# Patient Record
Sex: Male | Born: 1945 | Race: Black or African American | Hispanic: No | Marital: Married | Smoking: Never smoker
Health system: Southern US, Community
[De-identification: ages and names within clinical notes are randomized; demographics above are authoritative.]

---

## 2007-02-02 ENCOUNTER — Ambulatory Visit: Payer: Self-pay

## 2010-10-01 ENCOUNTER — Emergency Department: Payer: Self-pay | Admitting: *Deleted

## 2010-10-02 ENCOUNTER — Emergency Department: Payer: Self-pay | Admitting: Unknown Physician Specialty

## 2016-03-19 ENCOUNTER — Other Ambulatory Visit: Payer: Self-pay | Admitting: Internal Medicine

## 2016-03-19 DIAGNOSIS — I639 Cerebral infarction, unspecified: Secondary | ICD-10-CM

## 2016-03-23 ENCOUNTER — Other Ambulatory Visit (HOSPITAL_COMMUNITY): Payer: Self-pay | Admitting: Internal Medicine

## 2016-03-23 DIAGNOSIS — I639 Cerebral infarction, unspecified: Secondary | ICD-10-CM

## 2016-03-30 ENCOUNTER — Ambulatory Visit (HOSPITAL_COMMUNITY)
Admission: RE | Admit: 2016-03-30 | Discharge: 2016-03-30 | Disposition: A | Discharge status: Home / Self Care | Payer: Medicare Other | Source: Ambulatory Visit | Attending: Internal Medicine | Admitting: Internal Medicine

## 2016-03-30 DIAGNOSIS — G319 Degenerative disease of nervous system, unspecified: Secondary | ICD-10-CM | POA: Diagnosis not present

## 2016-03-30 DIAGNOSIS — I639 Cerebral infarction, unspecified: Secondary | ICD-10-CM | POA: Insufficient documentation

## 2017-07-13 ENCOUNTER — Emergency Department: Payer: Medicare Other

## 2017-07-13 ENCOUNTER — Encounter: Payer: Self-pay | Admitting: Internal Medicine

## 2017-07-13 ENCOUNTER — Inpatient Hospital Stay
Admission: EM | Admit: 2017-07-13 | Discharge: 2017-07-19 | DRG: 871 | Disposition: A | Discharge status: Hospice | Payer: Medicare Other | Attending: Internal Medicine | Admitting: Internal Medicine

## 2017-07-13 DIAGNOSIS — G309 Alzheimer's disease, unspecified: Secondary | ICD-10-CM | POA: Diagnosis present

## 2017-07-13 DIAGNOSIS — R0602 Shortness of breath: Secondary | ICD-10-CM

## 2017-07-13 DIAGNOSIS — F0390 Unspecified dementia without behavioral disturbance: Secondary | ICD-10-CM

## 2017-07-13 DIAGNOSIS — Z8521 Personal history of malignant neoplasm of larynx: Secondary | ICD-10-CM

## 2017-07-13 DIAGNOSIS — Y95 Nosocomial condition: Secondary | ICD-10-CM | POA: Diagnosis present

## 2017-07-13 DIAGNOSIS — E876 Hypokalemia: Secondary | ICD-10-CM | POA: Diagnosis present

## 2017-07-13 DIAGNOSIS — T8383XA Hemorrhage of genitourinary prosthetic devices, implants and grafts, initial encounter: Secondary | ICD-10-CM | POA: Diagnosis not present

## 2017-07-13 DIAGNOSIS — R1310 Dysphagia, unspecified: Secondary | ICD-10-CM | POA: Diagnosis present

## 2017-07-13 DIAGNOSIS — J9690 Respiratory failure, unspecified, unspecified whether with hypoxia or hypercapnia: Secondary | ICD-10-CM

## 2017-07-13 DIAGNOSIS — R319 Hematuria, unspecified: Secondary | ICD-10-CM | POA: Diagnosis not present

## 2017-07-13 DIAGNOSIS — R627 Adult failure to thrive: Secondary | ICD-10-CM | POA: Diagnosis present

## 2017-07-13 DIAGNOSIS — Z9911 Dependence on respirator [ventilator] status: Secondary | ICD-10-CM

## 2017-07-13 DIAGNOSIS — A419 Sepsis, unspecified organism: Secondary | ICD-10-CM | POA: Diagnosis present

## 2017-07-13 DIAGNOSIS — Z7401 Bed confinement status: Secondary | ICD-10-CM

## 2017-07-13 DIAGNOSIS — Z515 Encounter for palliative care: Secondary | ICD-10-CM | POA: Diagnosis present

## 2017-07-13 DIAGNOSIS — F0280 Dementia in other diseases classified elsewhere without behavioral disturbance: Secondary | ICD-10-CM | POA: Diagnosis present

## 2017-07-13 DIAGNOSIS — I10 Essential (primary) hypertension: Secondary | ICD-10-CM | POA: Diagnosis present

## 2017-07-13 DIAGNOSIS — J159 Unspecified bacterial pneumonia: Secondary | ICD-10-CM | POA: Diagnosis present

## 2017-07-13 DIAGNOSIS — E43 Unspecified severe protein-calorie malnutrition: Secondary | ICD-10-CM

## 2017-07-13 DIAGNOSIS — Z8673 Personal history of transient ischemic attack (TIA), and cerebral infarction without residual deficits: Secondary | ICD-10-CM

## 2017-07-13 DIAGNOSIS — Z681 Body mass index (BMI) 19 or less, adult: Secondary | ICD-10-CM

## 2017-07-13 DIAGNOSIS — J690 Pneumonitis due to inhalation of food and vomit: Secondary | ICD-10-CM | POA: Diagnosis present

## 2017-07-13 DIAGNOSIS — Z79899 Other long term (current) drug therapy: Secondary | ICD-10-CM

## 2017-07-13 DIAGNOSIS — Z7189 Other specified counseling: Secondary | ICD-10-CM | POA: Diagnosis not present

## 2017-07-13 DIAGNOSIS — I248 Other forms of acute ischemic heart disease: Secondary | ICD-10-CM | POA: Diagnosis present

## 2017-07-13 DIAGNOSIS — R739 Hyperglycemia, unspecified: Secondary | ICD-10-CM | POA: Diagnosis present

## 2017-07-13 DIAGNOSIS — Z66 Do not resuscitate: Secondary | ICD-10-CM | POA: Diagnosis not present

## 2017-07-13 DIAGNOSIS — R531 Weakness: Secondary | ICD-10-CM | POA: Diagnosis not present

## 2017-07-13 DIAGNOSIS — J189 Pneumonia, unspecified organism: Secondary | ICD-10-CM

## 2017-07-13 DIAGNOSIS — J9601 Acute respiratory failure with hypoxia: Secondary | ICD-10-CM | POA: Diagnosis present

## 2017-07-13 DIAGNOSIS — Z7951 Long term (current) use of inhaled steroids: Secondary | ICD-10-CM | POA: Diagnosis not present

## 2017-07-13 DIAGNOSIS — Y658 Other specified misadventures during surgical and medical care: Secondary | ICD-10-CM | POA: Diagnosis not present

## 2017-07-13 LAB — GLUCOSE, CAPILLARY
GLUCOSE-CAPILLARY: 144 mg/dL — AB (ref 65–99)
GLUCOSE-CAPILLARY: 101 mg/dL — AB (ref 65–99)

## 2017-07-13 LAB — URINALYSIS, ROUTINE W REFLEX MICROSCOPIC
BACTERIA UA: NONE SEEN
Bilirubin Urine: NEGATIVE
Glucose, UA: 50 mg/dL — AB
Ketones, ur: 20 mg/dL — AB
Leukocytes, UA: NEGATIVE
Nitrite: NEGATIVE
PROTEIN: 100 mg/dL — AB
SPECIFIC GRAVITY, URINE: 1.015 (ref 1.005–1.030)
pH: 6 (ref 5.0–8.0)

## 2017-07-13 LAB — BLOOD GAS, VENOUS
ACID-BASE DEFICIT: 1.8 mmol/L (ref 0.0–2.0)
Bicarbonate: 23.1 mmol/L (ref 20.0–28.0)
O2 Saturation: 92.3 %
PCO2 VEN: 39 mmHg — AB (ref 44.0–60.0)
PH VEN: 7.38 (ref 7.250–7.430)
Patient temperature: 37
pO2, Ven: 66 mmHg — ABNORMAL HIGH (ref 32.0–45.0)

## 2017-07-13 LAB — CBC WITH DIFFERENTIAL/PLATELET
Basophils Absolute: 0 10*3/uL (ref 0–0.1)
Basophils Relative: 0 %
Eosinophils Absolute: 0 10*3/uL (ref 0–0.7)
Eosinophils Relative: 0 %
HEMATOCRIT: 48.8 % (ref 40.0–52.0)
HEMOGLOBIN: 16.3 g/dL (ref 13.0–18.0)
LYMPHS PCT: 11 %
Lymphs Abs: 1.4 10*3/uL (ref 1.0–3.6)
MCH: 29.2 pg (ref 26.0–34.0)
MCHC: 33.4 g/dL (ref 32.0–36.0)
MCV: 87.3 fL (ref 80.0–100.0)
MONO ABS: 0.7 10*3/uL (ref 0.2–1.0)
MONOS PCT: 6 %
NEUTROS ABS: 10.3 10*3/uL — AB (ref 1.4–6.5)
Neutrophils Relative %: 83 %
Platelets: 233 10*3/uL (ref 150–440)
RBC: 5.59 MIL/uL (ref 4.40–5.90)
RDW: 15.8 % — AB (ref 11.5–14.5)
WBC: 12.4 10*3/uL — ABNORMAL HIGH (ref 3.8–10.6)

## 2017-07-13 LAB — LACTIC ACID, PLASMA
LACTIC ACID, VENOUS: 1.8 mmol/L (ref 0.5–1.9)
Lactic Acid, Venous: 1.9 mmol/L (ref 0.5–1.9)

## 2017-07-13 LAB — COMPREHENSIVE METABOLIC PANEL
ALBUMIN: 3.6 g/dL (ref 3.5–5.0)
ALT: 14 U/L — AB (ref 17–63)
AST: 28 U/L (ref 15–41)
Alkaline Phosphatase: 82 U/L (ref 38–126)
Anion gap: 12 (ref 5–15)
BUN: 18 mg/dL (ref 6–20)
CHLORIDE: 104 mmol/L (ref 101–111)
CO2: 24 mmol/L (ref 22–32)
Calcium: 9.7 mg/dL (ref 8.9–10.3)
Creatinine, Ser: 1.05 mg/dL (ref 0.61–1.24)
GFR calc Af Amer: >60 mL/min (ref >60)
GFR calc non Af Amer: >60 mL/min (ref >60)
GLUCOSE: 174 mg/dL — AB (ref 65–99)
Potassium: 3.4 mmol/L — ABNORMAL LOW (ref 3.5–5.1)
SODIUM: 140 mmol/L (ref 135–145)
Total Bilirubin: 0.8 mg/dL (ref 0.3–1.2)
Total Protein: 7.8 g/dL (ref 6.5–8.1)

## 2017-07-13 LAB — TROPONIN I: Troponin I: 0.03 ng/mL (ref <0.03)

## 2017-07-13 LAB — STREP PNEUMONIAE URINARY ANTIGEN: Strep Pneumo Urinary Antigen: NEGATIVE

## 2017-07-13 LAB — LIPASE, BLOOD: Lipase: 27 U/L (ref 11–51)

## 2017-07-13 LAB — PROCALCITONIN: Procalcitonin: 0.44 ng/mL

## 2017-07-13 LAB — PROTIME-INR
INR: 1.15
Prothrombin Time: 14.6 seconds (ref 11.4–15.2)

## 2017-07-13 LAB — MAGNESIUM: MAGNESIUM: 1.5 mg/dL — AB (ref 1.7–2.4)

## 2017-07-13 LAB — MRSA PCR SCREENING: MRSA BY PCR: NEGATIVE

## 2017-07-13 MED ORDER — SODIUM CHLORIDE 0.9 % IV BOLUS
1000.0000 mL | Freq: Once | INTRAVENOUS | Status: AC
  Administered 2017-07-13: 1000 mL via INTRAVENOUS

## 2017-07-13 MED ORDER — ORAL CARE MOUTH RINSE
15.0000 mL | Freq: Two times a day (BID) | OROMUCOSAL | Status: DC
  Administered 2017-07-13 – 2017-07-18 (×11): 15 mL via OROMUCOSAL

## 2017-07-13 MED ORDER — ENOXAPARIN SODIUM 40 MG/0.4ML ~~LOC~~ SOLN
40.0000 mg | Freq: Every day | SUBCUTANEOUS | Status: DC
  Administered 2017-07-13 – 2017-07-14 (×2): 40 mg via SUBCUTANEOUS
  Filled 2017-07-13 (×2): qty 0.4

## 2017-07-13 MED ORDER — FENTANYL 2500MCG IN NS 250ML (10MCG/ML) PREMIX INFUSION
0.0000 ug/h | INTRAVENOUS | Status: DC
  Administered 2017-07-13: 100 ug/h via INTRAVENOUS
  Administered 2017-07-13: 50 ug/h via INTRAVENOUS
  Filled 2017-07-13: qty 250

## 2017-07-13 MED ORDER — FAMOTIDINE IN NACL 20-0.9 MG/50ML-% IV SOLN
20.0000 mg | INTRAVENOUS | Status: DC
  Administered 2017-07-13 – 2017-07-14 (×2): 20 mg via INTRAVENOUS
  Filled 2017-07-13 (×2): qty 50

## 2017-07-13 MED ORDER — ALBUTEROL SULFATE (2.5 MG/3ML) 0.083% IN NEBU
2.5000 mg | INHALATION_SOLUTION | Freq: Four times a day (QID) | RESPIRATORY_TRACT | Status: DC
  Administered 2017-07-13: 2.5 mg via RESPIRATORY_TRACT
  Filled 2017-07-13: qty 3

## 2017-07-13 MED ORDER — FENTANYL CITRATE (PF) 100 MCG/2ML IJ SOLN
100.0000 ug | Freq: Once | INTRAMUSCULAR | Status: AC
  Administered 2017-07-13: 100 ug via INTRAVENOUS

## 2017-07-13 MED ORDER — VANCOMYCIN HCL IN DEXTROSE 1-5 GM/200ML-% IV SOLN
1000.0000 mg | Freq: Once | INTRAVENOUS | Status: DC
  Filled 2017-07-13: qty 200

## 2017-07-13 MED ORDER — POTASSIUM CHLORIDE 10 MEQ/100ML IV SOLN
10.0000 meq | INTRAVENOUS | Status: AC
  Administered 2017-07-13 (×4): 10 meq via INTRAVENOUS
  Filled 2017-07-13 (×4): qty 100

## 2017-07-13 MED ORDER — ENOXAPARIN SODIUM 40 MG/0.4ML ~~LOC~~ SOLN: 40.0000 mg | SUBCUTANEOUS | Status: DC

## 2017-07-13 MED ORDER — CHLORHEXIDINE GLUCONATE 0.12 % MT SOLN
15.0000 mL | Freq: Two times a day (BID) | OROMUCOSAL | Status: DC
  Administered 2017-07-13 – 2017-07-18 (×10): 15 mL via OROMUCOSAL
  Filled 2017-07-13 (×8): qty 15

## 2017-07-13 MED ORDER — ALBUTEROL SULFATE (2.5 MG/3ML) 0.083% IN NEBU: 2.5000 mg | INHALATION_SOLUTION | RESPIRATORY_TRACT | Status: DC | PRN

## 2017-07-13 MED ORDER — SODIUM CHLORIDE 0.9 % IV SOLN
2.0000 g | Freq: Two times a day (BID) | INTRAVENOUS | Status: DC
  Administered 2017-07-13 – 2017-07-18 (×10): 2 g via INTRAVENOUS
  Filled 2017-07-13 (×12): qty 2

## 2017-07-13 MED ORDER — SODIUM CHLORIDE 0.9 % IV SOLN
2.0000 g | Freq: Once | INTRAVENOUS | Status: AC
  Administered 2017-07-13: 2 g via INTRAVENOUS
  Filled 2017-07-13: qty 2

## 2017-07-13 MED ORDER — CARVEDILOL 3.125 MG PO TABS: 3.1250 mg | ORAL_TABLET | Freq: Two times a day (BID) | ORAL | Status: DC

## 2017-07-13 MED ORDER — KETAMINE HCL 10 MG/ML IJ SOLN
100.0000 mg | Freq: Once | INTRAMUSCULAR | Status: AC
  Administered 2017-07-13: 100 mg via INTRAVENOUS

## 2017-07-13 MED ORDER — ROCURONIUM BROMIDE 50 MG/5ML IV SOLN
60.0000 mg | Freq: Once | INTRAVENOUS | Status: AC
Start: 2017-07-13 — End: 2017-07-13
  Administered 2017-07-13: 60 mg via INTRAVENOUS

## 2017-07-13 MED ORDER — POTASSIUM CHLORIDE 20 MEQ PO PACK
40.0000 meq | PACK | Freq: Once | ORAL | Status: DC
  Filled 2017-07-13: qty 2

## 2017-07-13 MED ORDER — CARVEDILOL 3.125 MG PO TABS
3.1250 mg | ORAL_TABLET | Freq: Two times a day (BID) | ORAL | Status: DC
Start: 2017-07-13 — End: 2017-07-13
  Filled 2017-07-13: qty 1

## 2017-07-13 NOTE — Progress Notes (Signed)
Initial Nutrition Assessment  DOCUMENTATION CODES:   Severe malnutrition in context of chronic illness  INTERVENTION:  Will monitor for diet advancement. Consider SLP evaluation prior to diet advancement as patient has a history of dysphagia with a baseline diet of dysphagia 1 (pureed with thin liquids).  RD will order appropriate oral nutrition supplement to address malnutrition once diet is advanced.  Recommend checking phosphorus lab and supplementing as needed. Patient is at risk for refeeding syndrome. Noted potassium and magnesium have already been checked.  NUTRITION DIAGNOSIS:   Severe Malnutrition related to chronic illness(Alzheimer's disease, dysphagia, advanced age) as evidenced by severe fat depletion, moderate muscle depletion, severe muscle depletion.  GOAL:   Patient will meet greater than or equal to 90% of their needs  MONITOR:   PO intake, Supplement acceptance, Diet advancement, Labs, Weight trends, I & O's  REASON FOR ASSESSMENT:   Ventilator    ASSESSMENT:   72 year old male with PMHx of Alzheimer's dementia, depression, constipation who was admitted from [facility] with acute hypoxic respiratory failure from PNA. Patient was initially placed on mechanical ventilation but was extubated <12 hours later on 5/15.   Patient was initially assessed for mechanical ventilation, but was extubated following RD assessment.  Met with patient's family members at bedside. They report he resides at [facility]. He has had a poor appetite for a while, but they are unsure how much of his meals he eats. He is on a dysphagia 1 (pureed) diet with thin liquids due to dysphagia (verified this in chart). He is edentulous and does not have any dentures. They are unsure if he drinks any oral nutrition supplements (did not see any ordered at [facility]) and are also unsure of his weight history. There is no weight history in chart. RN obtained a weight of 138.5 lbs today. He  is '5\' 11"'$  according to the family. Patient is bed-bound and requires assistance with most ADLs.  Medications reviewed and include: cefepime, famotidine, potassium chloride 10 mEQ IV 4 times today, vancomycin.  Labs reviewed: CBG 144, Potassium 3.4, Magnesium 1.5.  Discussed with RN and on rounds.  NUTRITION - FOCUSED PHYSICAL EXAM:    Most Recent Value  Orbital Region  Severe depletion  Upper Arm Region  Severe depletion  Thoracic and Lumbar Region  Severe depletion  Buccal Region  Unable to assess  Temple Region  Severe depletion  Clavicle Bone Region  Severe depletion  Clavicle and Acromion Bone Region  Severe depletion  Scapular Bone Region  Unable to assess  Dorsal Hand  Severe depletion  Patellar Region  Moderate depletion  Anterior Thigh Region  Moderate depletion  Posterior Calf Region  Severe depletion  Edema (RD Assessment)  None  Hair  Reviewed  Eyes  Unable to assess  Mouth  Unable to assess  Skin  Reviewed  Nails  Reviewed     Diet Order:   Diet Order           Diet NPO time specified  Diet effective now          EDUCATION NEEDS:   Not appropriate for education at this time  Skin:  Skin Assessment: Reviewed RN Assessment  Last BM:  Unknown  Height:   Ht Readings from Last 1 Encounters:  07/13/17 '5\' 11"'$  (1.803 m)    Weight:   Wt Readings from Last 1 Encounters:  07/13/17 138 lb 7.2 oz (62.8 kg)    Ideal Body Weight:  78.2 kg  BMI:  Body mass index is 19.31 kg/m.  Estimated Nutritional Needs:   Kcal:  1856-3149 (MSJ x 1.2-1.4)  Protein:  80-95 grams (1.3-1.5 grams/kg)  Fluid:  1.6-1.8 L/day (25-30 mL/kg)  Willey Blade, MS, RD, LDN Office: [phone] Pager: [phone] After Hours/Weekend Pager: [phone]

## 2017-07-13 NOTE — Progress Notes (Signed)
Patient ID: Isaac Boyle, male   DOB: 1945/11/13, 72 y.o.   MRN: [id number]  [employer] PROGRESS NOTE  Isaac Boyle:[id number] DOB: 1945/06/17 DOA: 07/13/2017 PCP: Keane Police, MD  HPI/Subjective: Patient intubated in the emergency room.  The patient has a history of dementia.  He needs to be fed with all meals.  He has bedbound for the last 3 years.  The wife thinks he does recognize her.  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  Objective: Vitals:   07/13/17 1200 07/13/17 1300  BP: 101/78   Pulse: 96 87  Resp: 15 10  Temp: 98.8 F (37.1 C) 98.4 F (36.9 C)  SpO2: 98% 98%    Filed Weights   07/13/17 1100  Weight: 62.8 kg (138 lb 7.2 oz)    ROS: Review of Systems  Unable to perform ROS: Intubated   Exam: Physical Exam  HENT:  Nose: No mucosal edema.  Mouth/Throat: No oropharyngeal exudate or posterior oropharyngeal edema.  Mouth is dry.  Tube in the mouth has a little bit of blood in it.  Eyes: Conjunctivae and lids are normal.  Neck: No JVD present. Carotid bruit is not present. No edema present. No thyroid mass and no thyromegaly present.  Cardiovascular: S1 normal and S2 normal. Exam reveals no gallop.  No murmur heard. Respiratory: No respiratory distress. He has decreased breath sounds in the right lower field and the left lower field. He has no wheezes. He has no rhonchi. He has no rales.  GI: Soft. Bowel sounds are normal. There is no tenderness.  Musculoskeletal:       Right ankle: He exhibits no swelling.       Left ankle: He exhibits no swelling.  Lymphadenopathy:    He has no cervical adenopathy.  Neurological:  Was able to squeeze my hand when I saw him to command  Skin: Skin is warm. No rash noted. Nails show no clubbing.  Psychiatric:  Patient is intubated      Data Reviewed: Basic Metabolic Panel: Recent Labs  Lab 07/13/17 0409 07/13/17 0635  NA 140  --   K 3.4*  --   CL 104  --   CO2 24  --   GLUCOSE 174*   --   BUN 18  --   CREATININE 1.05  --   CALCIUM 9.7  --   MG  --  1.5*   Liver Function Tests: Recent Labs  Lab 07/13/17 0409  AST 28  ALT 14*  ALKPHOS 82  BILITOT 0.8  PROT 7.8  ALBUMIN 3.6   Recent Labs  Lab 07/13/17 0409  LIPASE 27   CBC: Recent Labs  Lab 07/13/17 0409  WBC 12.4*  NEUTROABS 10.3*  HGB 16.3  HCT 48.8  MCV 87.3  PLT 233   Cardiac Enzymes: Recent Labs  Lab 07/13/17 0409  TROPONINI 0.03*    CBG: Recent Labs  Lab 07/13/17 0616  GLUCAP 144*    Recent Results (from the past 240 hour(s))  Blood Culture (routine x 2)     Status: None (Preliminary result)   Collection Time: 07/13/17  4:09 AM  Result Value Ref Range Status   Specimen Description BLOOD RIGHT WRIST  Final   Special Requests   Final    BOTTLES DRAWN AEROBIC AND ANAEROBIC Blood Culture results may not be optimal due to an excessive volume of blood received in culture bottles   Culture   Final    NO GROWTH < 12 HOURS  Performed at [facility], [address] [place], [place] [address]    Report Status PENDING  Incomplete  Blood Culture (routine x 2)     Status: None (Preliminary result)   Collection Time: 07/13/17  4:09 AM  Result Value Ref Range Status   Specimen Description BLOOD LEFT AC  Final   Special Requests   Final    BOTTLES DRAWN AEROBIC AND ANAEROBIC Blood Culture results may not be optimal due to an excessive volume of blood received in culture bottles   Culture   Final    NO GROWTH < 12 HOURS Performed at [facility], [address], [place], [place] [address]    Report Status PENDING  Incomplete  Culture, respiratory (NON-Expectorated)     Status: None (Preliminary result)   Collection Time: 07/13/17  4:10 AM  Result Value Ref Range Status   Specimen Description   Final    TRACHEAL ASPIRATE Performed at [facility], [address] [place], [place] [address]    Special Requests   Final    Normal Performed at [facility], [address] [place], [place] [address]    Gram Stain   Final    FEW WBC PRESENT, PREDOMINANTLY PMN RARE GRAM POSITIVE COCCI IN PAIRS RARE GRAM POSITIVE RODS Performed at [facility], [address], [place], [place] [address]    Culture PENDING  Incomplete   Report Status PENDING  Incomplete  MRSA PCR Screening     Status: None   Collection Time: 07/13/17  7:33 AM  Result Value Ref Range Status   MRSA by PCR NEGATIVE NEGATIVE Final    Comment:        The GeneXpert MRSA Assay (FDA approved for NASAL specimens only), is one component of a comprehensive MRSA colonization surveillance program. It is not intended to diagnose MRSA infection nor to guide or monitor treatment for MRSA infections. Performed at [facility], [address] [place], [place] [address]      Studies: Dg Chest [facility] 1 View  Result Date: 07/13/2017 CLINICAL DATA:  Endotracheal tube placement. EXAM: PORTABLE CHEST 1 VIEW COMPARISON:  None. FINDINGS: The patient's endotracheal tube is seen ending 2 cm above the carina. An enteric tube is noted extending below the diaphragm, overlying the fundus of the stomach. This could be advanced approximately 5 cm. A small left pleural effusion is noted. Left basilar airspace opacity may reflect atelectasis or possibly infection. No pneumothorax is seen. The cardiomediastinal silhouette is unremarkable. No acute osseous abnormalities are identified. IMPRESSION: 1. Endotracheal tube seen ending 2 cm above the carina. 2. Enteric tube noted extending below the diaphragm, overlying the fundus of the stomach. This could be advanced approximately 5 cm, as deemed clinically appropriate. 3. Small left pleural effusion noted. Left basilar airspace opacity may reflect atelectasis or possibly infection. Electronically Signed   By: Roanna Raider M.D.   On: 07/13/2017 04:20    Scheduled Meds: . enoxaparin (LOVENOX) injection  40 mg Subcutaneous Daily    Continuous Infusions: . ceFEPime (MAXIPIME) IV    . famotidine (PEPCID) IV Stopped (07/13/17 1008)  . potassium chloride 10 mEq (07/13/17 1249)  . vancomycin      Assessment/Plan:  1. Clinical sepsis with healthcare associated pneumonia.  Started on cefepime and vancomycin.   2. Acute hypoxic respiratory failure.  Patient was intubated in the emergency room.  I do not see the ER physician notes at this time.  Case discussed  with critical care specialist to start the weaning process.  Patient was stopped on sedation. 3. End-stage dementia.  CODE STATUS discussed with son and wife at the bedside.  Son was thinking the patient should be a DNR but the wife was unsure at this point.  Patient needs total care with feeding and he is bedbound.  Unsure if he recognizes people at this point. 4. History of hypertension.  Coreg on hold with relative hypotension at this point.   Family Communication: Spoke with son and wife at the bedside Disposition Plan: To be determined  Consultants:  Critical care specialist  Antibiotics:  Vancomycin  Cefepime  Time spent: 30 minutes.  Patient is critically ill.  Izaak Sahr [employer]

## 2017-07-13 NOTE — ED Notes (Signed)
Pt resting quietly with family at the bedside. Discussed plan of care with family.

## 2017-07-13 NOTE — Progress Notes (Signed)
CODE SEPSIS - PHARMACY COMMUNICATION  **Broad Spectrum Antibiotics should be administered within 1 hour of Sepsis diagnosis**  Time Code Sepsis Called/Page Received: 1610 9604  Antibiotics Ordered: 0515 0409 vanc/cefepime  Time of 1st antibiotic administration: 5409 8119  Additional action taken by pharmacy:   If necessary, Name of Provider/Nurse Contacted:     Erich Montane ,PharmD Clinical Pharmacist  07/13/2017  6:07 AM

## 2017-07-13 NOTE — Progress Notes (Signed)
OG tube noted to be coiled in mouth upon assessment. Several attempts made to re-insert same without success. Dr. Sung Amabile aware and stated to hold off on further attempts due to possible extubation today. Will reevaluate if unable to extubate.

## 2017-07-13 NOTE — Progress Notes (Signed)
Patient is placed on high fowlers position, cuff deflated, suctioned orally and endotracheally and then extubated to 4 lpm O2 [place] 

## 2017-07-13 NOTE — H&P (Addendum)
[facility] - [place] at [facility]   PATIENT NAME: Isaac Boyle    MR#:  [id number]  DATE OF BIRTH:  June 27, 1945  DATE OF ADMISSION:  07/13/2017  PRIMARY CARE PHYSICIAN: Keane Police, MD   REQUESTING/REFERRING PHYSICIAN: Merrily Brittle, MD  CHIEF COMPLAINT:   Chief Complaint  Patient presents with  . Shortness of Breath    HISTORY OF PRESENT ILLNESS:  Isaac Boyle  is a 72 y.o. male with a known history of Alzheimer's dementia who presents from [facility] w/ SOB/respiratory failure. Pt's two sons and wife at bedside. Pt was apparently diagnosed w/ pneumonia by CXR during the day, and was started on Levaquin. Pt's wife states she was called at 0330AM on Wednesday 07/13/2017, was informed that pt tachypneic/hypoxemic, EMS called for respiratory distress and non-reassuring breathing. Intubated in ED. Tachycardic, BP stable. SIRS (+). Diffusely diminished breath sounds B/L (L > R), coarse expiratory breath sounds, bibasilar fine crackles on lung exam. Appears thin and chronically-ill. Pt apparently non-verbal, bedbound w/ poor functional status at baseline. Family states pt on pureed diet for dysphagia at facility. Labwork (+) mild hypokalemia (3.4), hyperglycemia (174), leukocytosis (12.4). CXR (+) "Small left pleural effusion noted. Left basilar airspace opacity may reflect atelectasis or possibly infection." Admitted for treatment of sepsis 2/2 suspect acute bacterial healthcare acquired pneumonia, w/ acute hypoxemic respiratory failure.  PAST MEDICAL HISTORY:  History reviewed. No pertinent past medical history.   Dementia  PAST SURGICAL HISTORY:  History reviewed. No pertinent surgical history.  SOCIAL HISTORY:   Social History   Tobacco Use  . Smoking status: Not on file  Substance Use Topics  . Alcohol use: Not on file    FAMILY HISTORY:  History reviewed. No pertinent family history.  DRUG ALLERGIES:  No Known Allergies  REVIEW  OF SYSTEMS:   Review of Systems  Unable to perform ROS: Intubated  Respiratory: Positive for shortness of breath.     MEDICATIONS AT HOME:   Prior to Admission medications   Medication Sig Start Date End Date Taking? Authorizing Provider  albuterol (ACCUNEB) 0.63 MG/3ML nebulizer solution Take 1 ampule by nebulization every 6 (six) hours as needed for wheezing.   Yes [provider]  carvedilol (COREG) 3.125 MG tablet Take 3.125 mg by mouth 2 (two) times daily with a meal.   Yes [provider]  levofloxacin (LEVAQUIN) 250 MG tablet Take 250 mg by mouth daily.   Yes [provider]  Melatonin 5 MG TABS Take 1 tablet by mouth at bedtime.   Yes [provider]  potassium chloride (K-DUR,KLOR-CON) 10 MEQ tablet Take 10 mEq by mouth daily.   Yes [provider]  psyllium (HYDROCIL/METAMUCIL) 95 % PACK Take 1 packet by mouth daily.   Yes [provider]      VITAL SIGNS:  Blood pressure (!) 144/96, pulse (!) 120, temperature 98.4 F (36.9 C), resp. rate 20, SpO2 98 %.  PHYSICAL EXAMINATION:  Physical Exam  Constitutional: He appears well-developed. He appears cachectic. He appears toxic. He has a sickly appearance. He appears ill. No distress. He is sedated and intubated.  HENT:  Head: Atraumatic.  Eyes: Conjunctivae and lids are normal. No scleral icterus.  Neck: Neck supple. No JVD present. No thyromegaly present.  Cardiovascular: Regular rhythm, S1 normal, S2 normal and normal heart sounds.  No extrasystoles are present. Tachycardia present. Exam reveals no gallop, no S3, no S4, no distant heart sounds and no friction rub.  No murmur  heard. Pulmonary/Chest: No accessory muscle usage or stridor. He is intubated. He has decreased breath sounds in the right upper field, the right middle field, the right lower field, the left upper field, the left middle field and the left lower field. He has no wheezes. He has rhonchi in the right  lower field and the left lower field. He has rales in the right upper field, the right middle field, the right lower field, the left upper field, the left middle field and the left lower field.  Abdominal: Soft. Bowel sounds are normal. He exhibits no distension. There is no tenderness. There is no rebound and no guarding.  Musculoskeletal: He exhibits no edema.  Lymphadenopathy:    He has no cervical adenopathy.  Neurological: He is unresponsive.  Intubated/sedated.  Skin: Skin is warm and dry. No rash noted. He is not diaphoretic. No erythema.  Psychiatric:  Intubated/sedated.   LABORATORY PANEL:   CBC Recent Labs  Lab 07/13/17 0409  WBC 12.4*  HGB 16.3  HCT 48.8  PLT 233   ------------------------------------------------------------------------------------------------------------------  Chemistries  Recent Labs  Lab 07/13/17 0409  NA 140  K 3.4*  CL 104  CO2 24  GLUCOSE 174*  BUN 18  CREATININE 1.05  CALCIUM 9.7  AST 28  ALT 14*  ALKPHOS 82  BILITOT 0.8   ------------------------------------------------------------------------------------------------------------------  Cardiac Enzymes Recent Labs  Lab 07/13/17 0409  TROPONINI 0.03*   ------------------------------------------------------------------------------------------------------------------  RADIOLOGY:  Dg Chest Port 1 View  Result Date: 07/13/2017 CLINICAL DATA:  Endotracheal tube placement. EXAM: PORTABLE CHEST 1 VIEW COMPARISON:  None. FINDINGS: The patient's endotracheal tube is seen ending 2 cm above the carina. An enteric tube is noted extending below the diaphragm, overlying the fundus of the stomach. This could be advanced approximately 5 cm. A small left pleural effusion is noted. Left basilar airspace opacity may reflect atelectasis or possibly infection. No pneumothorax is seen. The cardiomediastinal silhouette is unremarkable. No acute osseous abnormalities are identified. IMPRESSION: 1.  Endotracheal tube seen ending 2 cm above the carina. 2. Enteric tube noted extending below the diaphragm, overlying the fundus of the stomach. This could be advanced approximately 5 cm, as deemed clinically appropriate. 3. Small left pleural effusion noted. Left basilar airspace opacity may reflect atelectasis or possibly infection. Electronically Signed   By: Roanna Raider M.D.   On: 07/13/2017 04:20   IMPRESSION AND PLAN:   A/P: 44M w/ dementia presents from NH w/ sepsis 2/2 acute bacterial healthcare acquired pneumonia + acute hypoxemic respiratory failure, hypokalemia, hyperglycemia. -Intubated + sedated -Broad-spectrum ABx (Cefepime + Vanc) -BCx, sputum Cx, UStrep + ULegionella -Nebs for mobilization of secretions -Suction -Monitor PCT, Lactate -Replete K+ -Mag level pending -c/w home Coreg -NPO -Lovenox -Full code per pt's family -Admission, > 2 midnights -Likely a good hospice candidate (though family was already upset, and I chose not upset them further by broaching this subject)  ACP: Had a long conversation (~20+min) w/ family regarding code status. Pt's spouse states she wishes for pt to be full code. Explained disease course, natural history and progression of Alzheimer's dementia as a debilitating, irreversible, terminal medical condition. Pt's poor functional status will invariably correlate w/ poor long-term outcome, w/ CPR likely representing a medically futile measure. Did not compel family to change code status, simply provided information in hopes of stimulating further thought, discussion and reflection.  All the records are reviewed and case discussed with ED provider. Management plans discussed with the patient, family and they are in agreement.  CODE STATUS: Full code  TOTAL TIME TAKING CARE OF THIS PATIENT: 90 minutes.    Barbaraann Rondo M.D on 07/13/2017 at 6:40 AM  Between 7am to 6pm - Pager - [phone]  After 6pm go to [email or web address] - Air traffic controller  [facility]  Office  [phone]  CC: Primary care physician; Keane Police, MD   Note: This dictation was prepared with Dragon dictation along with smaller phrase technology. Any transcriptional errors that result from this process are unintentional.

## 2017-07-13 NOTE — Progress Notes (Signed)
Since taking over patients care this afternoon patient has been arousable to voice but has not followed commands.  Resists against repositioning and localizes pain.  Congested weak cough that is non-productive.  o2 sats mid 90's to 100 on 4 L nasal canula.  Family has visited.

## 2017-07-13 NOTE — Consult Note (Signed)
 Name: Isaac Boyle MRN: [id number] DOB: 09/25/1945    ADMISSION DATE:  07/13/2017 CONSULTATION DATE: 07/13/2017  REFERRING MD : Dr. Sridharan   CHIEF COMPLAINT: Shortness of Breath   BRIEF PATIENT DESCRIPTION:  72 yo male admitted with acute hypoxic respiratory failure secondary to pneumonia requiring mechanical intubation   SIGNIFICANT EVENTS/STUDIES:  05/15 Pt admitted to ICU mechanically intubated   HISTORY OF PRESENT ILLNESS:   This is a 72 yo male with a PMH of Dementia and Nonverbal at Baseline.  He presented to [facility] ER via EMS on 05/15 from [facility] with hypoxia O2 sats 84% on RA requiring NRB.  Per ER notes he was diagnosed with pneumonia on 05/14 and prescribed Levaquin.  In the ER due to worsening respiratory distress pt mechanically intubated.  CXR concerning for possible pneumonia vs. atelectasis.  He was subsequently admitted to ICU by hospitalist team for further workup and treatment.   PAST MEDICAL HISTORY :   has no past medical history on file.  has no past surgical history on file. Prior to Admission medications   Medication Sig Start Date End Date Taking? Authorizing Provider  albuterol (ACCUNEB) 0.63 MG/3ML nebulizer solution Take 1 ampule by nebulization every 6 (six) hours as needed for wheezing.   Yes [provider]  carvedilol (COREG) 3.125 MG tablet Take 3.125 mg by mouth 2 (two) times daily with a meal.   Yes [provider]  levofloxacin (LEVAQUIN) 250 MG tablet Take 250 mg by mouth daily.   Yes [provider]  Melatonin 5 MG TABS Take 1 tablet by mouth at bedtime.   Yes [provider]  potassium chloride (K-DUR,KLOR-CON) 10 MEQ tablet Take 10 mEq by mouth daily.   Yes [provider]  psyllium (HYDROCIL/METAMUCIL) 95 % PACK Take 1 packet by mouth daily.   Yes [provider]   No Known Allergies  FAMILY HISTORY:  family history is not on file. SOCIAL HISTORY:    REVIEW OF SYSTEMS:     Unable to assess pt intubated   SUBJECTIVE:  Unable to assess pt intubated   VITAL SIGNS: Temp:  [98.3 F (36.8 C)] 98.3 F (36.8 C) (05/15 0430) Pulse Rate:  [124-143] 124 (05/15 0430) Resp:  [20-21] 21 (05/15 0430) BP: (151-170)/(111-113) 154/112 (05/15 0430) SpO2:  [93 %-98 %] 98 % (05/15 0430) FiO2 (%):  [100 %] 100 % (05/15 0400)  PHYSICAL EXAMINATION: General: acutely ill male, NAD mechanically intubated  Neuro: sedated, not following commands, PERRL HEENT: supple, no JVD Cardiovascular: sinus tach, no R/G Lungs: rhonchi and diminished throughout, even, non labored  Abdomen: +BS x4, soft, non distended  Musculoskeletal: normal bulk and tone, no edema  Skin: intact no rashes or lesions   Recent Labs  Lab 07/13/17 0409  NA 140  K 3.4*  CL 104  CO2 24  BUN 18  CREATININE 1.05  GLUCOSE 174*   Recent Labs  Lab 07/13/17 0409  HGB 16.3  HCT 48.8  WBC 12.4*  PLT 233   Dg Chest Port 1 View  Result Date: 07/13/2017 CLINICAL DATA:  Endotracheal tube placement. EXAM: PORTABLE CHEST 1 VIEW COMPARISON:  None. FINDINGS: The patient's endotracheal tube is seen ending 2 cm above the carina. An enteric tube is noted extending below the diaphragm, overlying the fundus of the stomach. This could be advanced approximately 5 cm. A small left pleural effusion is noted. Left basilar airspace opacity may reflect atelectasis or possibly infection. No pneumothorax is seen.   The cardiomediastinal silhouette is unremarkable. No acute osseous abnormalities are identified. IMPRESSION: 1. Endotracheal tube seen ending 2 cm above the carina. 2. Enteric tube noted extending below the diaphragm, overlying the fundus of the stomach. This could be advanced approximately 5 cm, as deemed clinically appropriate. 3. Small left pleural effusion noted. Left basilar airspace opacity may reflect atelectasis or possibly infection. Electronically Signed   By: Garald Balding M.D.   On: 07/13/2017 04:20     ASSESSMENT / PLAN: Acute hypoxic respiratory failure secondary to pneumonia  Hypokalemia  Elevated troponin likely secondary to demand ischemia in setting of respiratory failure  P: Full vent support-vent settings reviewed and established  SBT once all parameters met  VAP bundle implemented  Continuous telemetry monitoring  Trend troponin's  Trend BMP Replace electrolytes as indicated  Monitor UOP  SUP px: iv pepcid Keep NPO for now  Trend WBC and monitor fever curve Follow cultures Continue current abx VTE px: subq lovenox  Trend CBC Monitor for s/sx of bleeding and transfuse for hgb <7 RASS goal 0 to -1 Fentanyl gtt to maintain RASS goal and for pain management WUA daily   Marda Stalker, [place] Pager [phone] (please enter 7 digits) PCCM Consult Pager [phone] (please enter 7 digits)

## 2017-07-13 NOTE — Progress Notes (Signed)
eLink Physician-Brief Progress Note Patient Name: Isaac Boyle DOB: May 02, 1945 MRN: [id number]   Date of Service  07/13/2017  HPI/Events of Note  72 yo male resident of NH. Presents with SOB and hypoxia. Diagnosis - pneumonia. Now intubated and ventilated. PCCM asked to assume care in the ICU. Incomplete ED physician note in Epic. No H&P in Epic either.  VSS.   eICU Interventions  No new orders.      Intervention Category Evaluation Type: New Patient Evaluation  Lenell Antu 07/13/2017, 6:02 AM

## 2017-07-13 NOTE — ED Provider Notes (Signed)
[facility] Emergency Department Provider Note  ____________________________________________   First MD Initiated Contact with Patient 07/13/17 [phone]     (approximate)  I have reviewed the triage vital signs and the nursing notes.   HISTORY  Chief Complaint Shortness of Breath  Level 5 exemption history limited by the patient's clinical condition  HPI Isaac Boyle is a 72 y.o. male who comes to the emergency department via EMS with cough and shortness of breath.  The patient has a history of severe dementia and apparently at his nursing home today was diagnosed with pneumonia and given a first dose of Levaquin.  At some point his respiratory status worsened in the called 911.  The patient himself is unable to provide any meaningful history.  History reviewed. No pertinent past medical history.  Patient Active Problem List   Diagnosis Date Noted  . Healthcare associated bacterial pneumonia 07/13/2017  . Protein-calorie malnutrition, severe 07/13/2017    History reviewed. No pertinent surgical history.  Prior to Admission medications   Medication Sig Start Date End Date Taking? Authorizing Provider  albuterol (ACCUNEB) 0.63 MG/3ML nebulizer solution Take 1 ampule by nebulization every 6 (six) hours as needed for wheezing.   Yes [provider]  carvedilol (COREG) 3.125 MG tablet Take 3.125 mg by mouth 2 (two) times daily with a meal.   Yes [provider]  levofloxacin (LEVAQUIN) 250 MG tablet Take 250 mg by mouth daily.   Yes [provider]  Melatonin 5 MG TABS Take 1 tablet by mouth at bedtime.   Yes [provider]  potassium chloride (K-DUR,KLOR-CON) 10 MEQ tablet Take 10 mEq by mouth daily.   Yes [provider]  psyllium (HYDROCIL/METAMUCIL) 95 % PACK Take 1 packet by mouth daily.   Yes [provider]    Allergies Patient has no known allergies.  History reviewed. No pertinent family  history.  Social History Social History   Tobacco Use  . Smoking status: Not on file  Substance Use Topics  . Alcohol use: Not on file  . Drug use: Not on file    Review of Systems Level 5 exemption history limited by the patient's clinical condition  ____________________________________________   PHYSICAL EXAM:  VITAL SIGNS: ED Triage Vitals  Enc Vitals Group     BP      Pulse      Resp      Temp      Temp src      SpO2      Weight      Height      Head Circumference      Peak Flow      Pain Score      Pain Loc      Pain Edu?      Excl. in [place]?     Constitutional: Appears critically ill in severe respiratory distress with coarse breath sounds elevated respiratory rate unable to speak and coughing thick purulent sputum Eyes: PERRL EOMI. Head: Atraumatic. Nose: No congestion/rhinnorhea. Mouth/Throat: No trismus Neck: No stridor.   Cardiovascular: Tachycardic rate, regular rhythm. Grossly normal heart sounds.  Good peripheral circulation. Respiratory: Severe respiratory distress coarse breath sounds throughout Gastrointestinal: Soft nontender Musculoskeletal: No lower extremity edema   Neurologic: Moves all 4 Skin:  Skin is warm, dry and intact. No rash noted. Psychiatric: Severe dementia   ____________________________________________   DIFFERENTIAL includes but not limited to  Sepsis, pneumonia, pneumothorax, pulmonary embolism, urinary tract infection, dehydration ____________________________________________  LABS (all labs ordered are listed, but only abnormal results are displayed)  Labs Reviewed  COMPREHENSIVE METABOLIC PANEL - Abnormal; Notable for the following components:      Result Value   Potassium 3.4 (*)    Glucose, Bld 174 (*)    ALT 14 (*)    All other components within normal limits  TROPONIN I - Abnormal; Notable for the following components:   Troponin I 0.03 (*)    All other components within normal limits  CBC WITH  DIFFERENTIAL/PLATELET - Abnormal; Notable for the following components:   WBC 12.4 (*)    RDW 15.8 (*)    Neutro Abs 10.3 (*)    All other components within normal limits  URINALYSIS, ROUTINE W REFLEX MICROSCOPIC - Abnormal; Notable for the following components:   Color, Urine YELLOW (*)    APPearance CLEAR (*)    Glucose, UA 50 (*)    Hgb urine dipstick SMALL (*)    Ketones, ur 20 (*)    Protein, ur 100 (*)    All other components within normal limits  BLOOD GAS, VENOUS - Abnormal; Notable for the following components:   pCO2, Ven 39 (*)    pO2, Ven 66.0 (*)    All other components within normal limits  GLUCOSE, CAPILLARY - Abnormal; Notable for the following components:   Glucose-Capillary 144 (*)    All other components within normal limits  MAGNESIUM - Abnormal; Notable for the following components:   Magnesium 1.5 (*)    All other components within normal limits  CULTURE, BLOOD (ROUTINE X 2)  CULTURE, BLOOD (ROUTINE X 2)  CULTURE, RESPIRATORY (NON-EXPECTORATED)  MRSA PCR SCREENING  CULTURE, RESPIRATORY (NON-EXPECTORATED)  URINE CULTURE  LACTIC ACID, PLASMA  LACTIC ACID, PLASMA  LIPASE, BLOOD  PROCALCITONIN  PROTIME-INR  STREP PNEUMONIAE URINARY ANTIGEN  HIV ANTIBODY (ROUTINE TESTING)  LEGIONELLA PNEUMOPHILA SEROGP 1 UR AG  PROCALCITONIN  CBC  BASIC METABOLIC PANEL    Lab work reviewed by me with slightly elevated troponin likely secondary to demand.  Elevated white count likely secondary to infection __________________________________________  EKG  ED ECG REPORT I, Darel Hong, the attending physician, personally viewed and interpreted this ECG.  Date: 07/13/2017 EKG Time:  Rate: 128 Rhythm: Sinus tachycardia QRS Axis: normal Intervals: normal ST/T Wave abnormalities: normal Narrative Interpretation: no evidence of acute ischemia  ____________________________________________  RADIOLOGY  Chest x-ray reviewed by me with ET tube in adequate  position.  Concerning for left lower lobe pneumonia ____________________________________________   PROCEDURES  Procedure(s) performed: Yes  .Critical Care Performed by: Darel Hong, MD Authorized by: Darel Hong, MD   Critical care provider statement:    Critical care time (minutes):  40   Critical care time was exclusive of:  Separately billable procedures and treating other patients   Critical care was necessary to treat or prevent imminent or life-threatening deterioration of the following conditions:  Respiratory failure   Critical care was time spent personally by me on the following activities:  Development of treatment plan with patient or surrogate, discussions with consultants, evaluation of patient's response to treatment, examination of patient, obtaining history from patient or surrogate, ordering and performing treatments and interventions, ordering and review of laboratory studies, ordering and review of radiographic studies, pulse oximetry, re-evaluation of patient's condition and review of old charts Procedure Name: Intubation Date/Time: 07/13/2017 9:54 PM Performed by: Darel Hong, MD Pre-anesthesia Checklist: Patient identified, Emergency Drugs available, Suction available and Patient being monitored Oxygen Delivery Method: Non-rebreather  mask Preoxygenation: Pre-oxygenation with 100% oxygen Induction Type: IV induction and Rapid sequence Laryngoscope Size: Mac and 4 Grade View: Grade I Number of attempts: 1 Placement Confirmation: ETT inserted through vocal cords under direct vision,  CO2 detector and Breath sounds checked- equal and bilateral Secured at: 22 cm Tube secured with: ETT holder Dental Injury: Teeth and Oropharynx as per pre-operative assessment        Critical Care performed: Yes  Observation: no ____________________________________________   INITIAL IMPRESSION / ASSESSMENT AND PLAN / ED COURSE  Pertinent labs & imaging results  that were available during my care of the patient were reviewed by me and considered in my medical decision making (see chart for details).  The patient arrives to the emergency department critically ill-appearing.  He is tachycardic, hypoxic, elevated respiratory rate, and coughing thick purulent material.  Decision was made to intubate emergently for the patient's own safety given high clinical suspicion for hospital associated pneumonia and impending respiratory failure.  Intubated without difficulty and during intubation I noted a thick amount of purulent material coming from his vocal cords.  Been placed on fentanyl sedation and broad-spectrum antibiotics.  Fluid resuscitation for sepsis.  I had a lengthy discussion with the patient's wife and 2 children and brought them to bedside and discussed the critical nature of the patient's illness.  I then discussed with the hospitalist who has graciously agreed to admit the patient to his service.  The patient is admitted in critical condition.      ____________________________________________   FINAL CLINICAL IMPRESSION(S) / ED DIAGNOSES  Final diagnoses:  Acute respiratory failure with hypoxia (HCC)  Sepsis, due to unspecified organism [facility])  Hospital acquired PNA      NEW MEDICATIONS STARTED DURING THIS VISIT:  Current Discharge Medication List       Note:  This document was prepared using Dragon voice recognition software and may include unintentional dictation errors.     Darel Hong, MD 07/13/17 2158

## 2017-07-13 NOTE — ED Triage Notes (Signed)
Pt from [facility] with sob. dx with pneumonia today. 84% on room air. duoneb was given by EMS. 97% non RB 159/107 per EMS. Pt non-verbal at baseline. Prescribed Levaquin and has taken first dose per facility.

## 2017-07-13 NOTE — Consult Note (Signed)
Pharmacy Antibiotic Note  Isaac Boyle is a 72 y.o. male admitted on 07/13/2017 with  pneumonia.  Pharmacy has been consulted for Cefepime dosing. Patient received cefepime 2g IV x 1 dose in ED   Plan: Start Cefepime 2g IV every 12 hours  Height:  (180.3 cm) Weight: 138 lb 7.2 oz (62.8 kg) IBW/kg (Calculated) : 75.3  Temp (24hrs), Avg:98.9 F (37.2 C), Min:98.3 F (36.8 C), Max:99.3 F (37.4 C)  Recent Labs  Lab 07/13/17 0409 07/13/17 0635  WBC 12.4*  --   CREATININE 1.05  --   LATICACIDVEN 1.9 1.8    Estimated Creatinine Clearance: 57.3 mL/min (by C-G formula based on SCr of 1.05 mg/dL).    No Known Allergies  Antimicrobials this admission: 5/15 vancomycin >> x 1 dose 5/15 cefepime  >>   Microbiology results: 5/15 BCx: pending  5/15 UCx: pending 5/15 Sputum: pending 5/15 MRSA PCR: negative   Thank you for allowing pharmacy to be a part of this patient's care.  Gardner Candle, PharmD, BCPS Clinical Pharmacist 07/13/2017 11:58 AM

## 2017-07-14 ENCOUNTER — Inpatient Hospital Stay: Payer: Medicare Other

## 2017-07-14 ENCOUNTER — Other Ambulatory Visit: Payer: Self-pay

## 2017-07-14 DIAGNOSIS — J9601 Acute respiratory failure with hypoxia: Secondary | ICD-10-CM

## 2017-07-14 DIAGNOSIS — J189 Pneumonia, unspecified organism: Secondary | ICD-10-CM

## 2017-07-14 DIAGNOSIS — Z515 Encounter for palliative care: Secondary | ICD-10-CM

## 2017-07-14 DIAGNOSIS — A419 Sepsis, unspecified organism: Principal | ICD-10-CM

## 2017-07-14 DIAGNOSIS — R531 Weakness: Secondary | ICD-10-CM

## 2017-07-14 DIAGNOSIS — Z7189 Other specified counseling: Secondary | ICD-10-CM

## 2017-07-14 DIAGNOSIS — Z66 Do not resuscitate: Secondary | ICD-10-CM

## 2017-07-14 LAB — LEGIONELLA PNEUMOPHILA SEROGP 1 UR AG: L. pneumophila Serogp 1 Ur Ag: NEGATIVE

## 2017-07-14 LAB — CBC
HCT: 43.8 % (ref 40.0–52.0)
HEMOGLOBIN: 14.2 g/dL (ref 13.0–18.0)
MCH: 28.7 pg (ref 26.0–34.0)
MCHC: 32.4 g/dL (ref 32.0–36.0)
MCV: 88.8 fL (ref 80.0–100.0)
Platelets: 155 10*3/uL (ref 150–440)
RBC: 4.93 MIL/uL (ref 4.40–5.90)
RDW: 15.5 % — AB (ref 11.5–14.5)
WBC: 11.6 10*3/uL — ABNORMAL HIGH (ref 3.8–10.6)

## 2017-07-14 LAB — BASIC METABOLIC PANEL
Anion gap: 10 (ref 5–15)
BUN: 16 mg/dL (ref 6–20)
CALCIUM: 9.3 mg/dL (ref 8.9–10.3)
CHLORIDE: 108 mmol/L (ref 101–111)
CO2: 23 mmol/L (ref 22–32)
CREATININE: 1.08 mg/dL (ref 0.61–1.24)
GFR calc non Af Amer: >60 mL/min (ref >60)
Glucose, Bld: 95 mg/dL (ref 65–99)
Potassium: 3.9 mmol/L (ref 3.5–5.1)
SODIUM: 141 mmol/L (ref 135–145)

## 2017-07-14 LAB — URINE CULTURE: CULTURE: NO GROWTH

## 2017-07-14 LAB — PROCALCITONIN: Procalcitonin: 1.72 ng/mL

## 2017-07-14 LAB — HIV ANTIBODY (ROUTINE TESTING W REFLEX): HIV Screen 4th Generation wRfx: NONREACTIVE

## 2017-07-14 MED ORDER — HYDRALAZINE HCL 20 MG/ML IJ SOLN
10.0000 mg | Freq: Four times a day (QID) | INTRAMUSCULAR | Status: DC | PRN
  Administered 2017-07-15 – 2017-07-17 (×3): 10 mg via INTRAVENOUS
  Filled 2017-07-14 (×3): qty 1

## 2017-07-14 MED ORDER — KCL IN DEXTROSE-NACL 20-5-0.45 MEQ/L-%-% IV SOLN
INTRAVENOUS | Status: DC
  Administered 2017-07-14 – 2017-07-18 (×7): via INTRAVENOUS
  Filled 2017-07-14 (×10): qty 1000

## 2017-07-14 NOTE — Plan of Care (Addendum)
Foley cath [facility], UOP into condom cath minimal, no bleeding from penis, Urine continues pink/red, small BM this shift, SLP ordered to assess swallowing and ability to accept POs, pt restless, skin WDL, VS WDL on 4 liters [place], family involved in POC, questions answered

## 2017-07-14 NOTE — Progress Notes (Signed)
Report called to RN Terri on 2C, pt transported to room 210 on 2C bed, O2 tank on 4 liters [place], no personal belongings in room, chart and meds taken with him, VSS on 4 liters.  Family agreeable to transfer and went to wait for him in his new room.  Pt turned and skin assessed with RN Terri.  Pt with his fluids infusing at 75 ml/hr and O2 flowing at 4 liters when writer left him with his assigned 2C RN.  Family at bedside

## 2017-07-14 NOTE — Progress Notes (Signed)
   07/14/17 1049  Clinical Encounter Type  Visited With Patient and family together  Visit Type Initial   Chaplain made introductory visit to patient and family, educated on chaplain services.  Family expressed appreciation for visit, but no needs reported at present.  Chaplain encouraged family to have chaplain paged as needed.

## 2017-07-14 NOTE — Progress Notes (Addendum)
He has tolerated extubation well since yesterday.  He has a rattling respiratory breath sounds and requires some stimulation to get him to cough.  He seems more alert.  Remains nonverbal.  Palliative care met with his family today and he is now DNR.  Vitals:   07/14/17 1200 07/14/17 1300 07/14/17 1400 07/14/17 1500  BP: (!) 136/117  (!) 143/95   Pulse: (!) 102 84 95 100  Resp: (!) _0 Temp: 98.4 F (36.9 C)     TempSrc: Axillary     SpO2: 93% 97% 92% 97%  Weight:      Height:        No overt respiratory distress Edentulous, otherwise HEENT WNL No JVD R >L rhonchi, no wheezes Regular, no M NABS, NT, soft Extremities warm, no edema No focal neurologic findings  BMP Latest Ref Rng & Units 07/14/2017 07/13/2017  Glucose 65 - 99 mg/dL 95 174(H)  BUN 6 - 20 mg/dL 16 18  Creatinine 0.61 - 1.24 mg/dL 1.08 1.05  Sodium 135 - 145 mmol/L 141 140  Potassium 3.5 - 5.1 mmol/L 3.9 3.4(L)  Chloride 101 - 111 mmol/L 108 104  CO2 22 - 32 mmol/L 23 24  Calcium 8.9 - 10.3 mg/dL 9.3 9.7   CBC Latest Ref Rng & Units 07/14/2017 07/13/2017  WBC 3.8 - 10.6 K/uL 11.6(H) 12.4(H)  Hemoglobin 13.0 - 18.0 g/dL 14.2 16.3  Hematocrit 40.0 - 52.0 % 43.8 48.8  Platelets 150 - 440 K/uL 155 233   CXR: Improving aeration LLL.  Persistent opacity consistent with pneumonia versus atelectasis  IMPRESSION: Very advanced dementia Acute hypoxemic respiratory failure LLL PNA - likely aspiration Hypokalemia, recurrent Chronic dysphagia - poor candidate for G-tube Protein-calorie malnutrition  PLAN/REC: Patient is now DNR Transfer to MedSurg floor Continue cefepime for now Follow-up respiratory culture and narrow antibiotics as indicated IVFs initiated due to n.p.o. Status We will need to decide on nutrition  Comfort feeds versus NG tube placement  After transfer, PCCM will sign off. Please call if we can be of further assistance    Merton Border, MD PCCM service Mobile [phone] Pager  [phone] 07/14/2017 3:50 PM

## 2017-07-14 NOTE — Progress Notes (Signed)
Pt's foley ordered DCd.  Foley balloon would not deflate, foley cut in two places, balloon still would not completely deflate, foley did come out with traction but pt has some bleeding from penis at this time.  Will continue to monitor

## 2017-07-14 NOTE — Progress Notes (Signed)
Dr. Imogene Burn notified of sustained elevated BP trend w/o regular or PRN medication; acknowledged, new order written. Windy Carina, RN8:38 PM 07/14/2017

## 2017-07-14 NOTE — Progress Notes (Signed)
Patient ID: AUNDREY ELAHI, male   DOB: 1945/12/09, 72 y.o.   MRN: [id number]  [facility] PROGRESS NOTE  EATHEN BUDREAU WUX:[id number] DOB: March 25, 1945 DOA: 07/13/2017 PCP: Keane Police, MD  HPI/Subjective: Patient extubated yesterday afternoon.  Answers a few questions.  History of dementia.  Hematuria secondary to Foley trauma.  Objective: Vitals:   07/14/17 1100 07/14/17 1200  BP: (!) 168/115 (!) 136/117  Pulse: (!) 122 (!) 102  Resp: (!) 24 (!) 24  Temp:  98.4 F (36.9 C)  SpO2: 100% 93%    Filed Weights   07/13/17 1100  Weight: 62.8 kg (138 lb 7.2 oz)    ROS: Review of Systems  Unable to perform ROS: Dementia  Respiratory: Negative for shortness of breath.   Cardiovascular: Negative for chest pain.  Gastrointestinal: Negative for abdominal pain.   Exam: Physical Exam  HENT:  Nose: No mucosal edema.  Mouth/Throat: No oropharyngeal exudate or posterior oropharyngeal edema.  Mouth is dry.  Tube in the mouth has a little bit of blood in it.  Eyes: Pupils are equal, round, and reactive to light. Conjunctivae and lids are normal.  Neck: No JVD present. Carotid bruit is not present. No edema present. No thyroid mass and no thyromegaly present.  Cardiovascular: S1 normal and S2 normal. Exam reveals no gallop.  No murmur heard. Respiratory: No respiratory distress. He has decreased breath sounds in the right lower field and the left lower field. He has no wheezes. He has rhonchi in the right lower field and the left lower field. He has no rales.  GI: Soft. Bowel sounds are normal. There is no tenderness.  Musculoskeletal:       Right ankle: He exhibits no swelling.       Left ankle: He exhibits no swelling.  Lymphadenopathy:    He has no cervical adenopathy.  Neurological: He is alert.  Patient able to move his extremities on his own.  Skin: Skin is warm. No rash noted. Nails show no clubbing.  Psychiatric: His affect is blunt.      Data  Reviewed: Basic Metabolic Panel: Recent Labs  Lab 07/13/17 0409 07/13/17 0635 07/14/17 0655  NA 140  --  141  K 3.4*  --  3.9  CL 104  --  108  CO2 24  --  23  GLUCOSE 174*  --  95  BUN 18  --  16  CREATININE 1.05  --  1.08  CALCIUM 9.7  --  9.3  MG  --  1.5*  --    Liver Function Tests: Recent Labs  Lab 07/13/17 0409  AST 28  ALT 14*  ALKPHOS 82  BILITOT 0.8  PROT 7.8  ALBUMIN 3.6   Recent Labs  Lab 07/13/17 0409  LIPASE 27   CBC: Recent Labs  Lab 07/13/17 0409 07/14/17 0655  WBC 12.4* 11.6*  NEUTROABS 10.3*  --   HGB 16.3 14.2  HCT 48.8 43.8  MCV 87.3 88.8  PLT 233 155   Cardiac Enzymes: Recent Labs  Lab 07/13/17 0409  TROPONINI 0.03*    CBG: Recent Labs  Lab 07/13/17 0616 07/13/17 2324  GLUCAP 144* 101*    Recent Results (from the past 240 hour(s))  Blood Culture (routine x 2)     Status: None (Preliminary result)   Collection Time: 07/13/17  4:09 AM  Result Value Ref Range Status   Specimen Description BLOOD RIGHT WRIST  Final   Special Requests   Final  BOTTLES DRAWN AEROBIC AND ANAEROBIC Blood Culture results may not be optimal due to an excessive volume of blood received in culture bottles   Culture   Final    NO GROWTH 1 DAY Performed at [facility], [address] [place], [place] [address]    Report Status PENDING  Incomplete  Blood Culture (routine x 2)     Status: None (Preliminary result)   Collection Time: 07/13/17  4:09 AM  Result Value Ref Range Status   Specimen Description BLOOD LEFT AC  Final   Special Requests   Final    BOTTLES DRAWN AEROBIC AND ANAEROBIC Blood Culture results may not be optimal due to an excessive volume of blood received in culture bottles   Culture   Final    NO GROWTH 1 DAY Performed at [facility], [address], [place], [place] [address]    Report Status PENDING  Incomplete  Urine culture     Status: None   Collection Time: 07/13/17  4:09 AM  Result Value Ref  Range Status   Specimen Description   Final    URINE, RANDOM Performed at [facility], [address] [place], [place] [address]    Special Requests   Final    NONE Performed at [facility], [address] [place], [place] [address]    Culture   Final    NO GROWTH Performed at [facility], [address], [place], [place] [address]    Report Status 07/14/2017 FINAL  Final  Culture, respiratory (NON-Expectorated)     Status: None (Preliminary result)   Collection Time: 07/13/17  4:10 AM  Result Value Ref Range Status   Specimen Description   Final    TRACHEAL ASPIRATE Performed at [facility], [address], [place], [place] [address]    Special Requests   Final    Normal Performed at [facility], [address] [place], [place] [address]    Gram Stain   Final    FEW WBC PRESENT, PREDOMINANTLY PMN RARE GRAM POSITIVE COCCI IN PAIRS RARE GRAM POSITIVE RODS    Culture   Final    CULTURE REINCUBATED FOR BETTER GROWTH Performed at [facility], [address] [place], [place] [address]    Report Status PENDING  Incomplete  MRSA PCR Screening     Status: None   Collection Time: 07/13/17  7:33 AM  Result Value Ref Range Status   MRSA by PCR NEGATIVE NEGATIVE Final    Comment:        The GeneXpert MRSA Assay (FDA approved for NASAL specimens only), is one component of a comprehensive MRSA colonization surveillance program. It is not intended to diagnose MRSA infection nor to guide or monitor treatment for MRSA infections. Performed at [facility], [address] [place], [place] [address]   Culture, respiratory (NON-Expectorated)     Status: None (Preliminary result)   Collection Time: 07/13/17  9:25 AM  Result Value Ref Range Status   Specimen Description   Final    TRACHEAL ASPIRATE Performed at [facility], [address], [place], [place] [address]    Special Requests   Final     NONE Performed at [facility], [address] [place], [place] [address]    Gram Stain   Final    RARE WBC PRESENT, PREDOMINANTLY PMN NO ORGANISMS SEEN    Culture   Final    CULTURE REINCUBATED  FOR BETTER GROWTH Performed at [facility], [address], [place], [place] [address]    Report Status PENDING  Incomplete     Studies: Dg Chest Port 1 View  Result Date: 07/14/2017 CLINICAL DATA:  Respiratory failure. EXAM: PORTABLE CHEST 1 VIEW COMPARISON:  Radiograph of Jul 13, 2017. FINDINGS: The heart size and mediastinal contours are within normal limits. No pneumothorax or pleural effusion is noted. Endotracheal and nasogastric tubes have been removed. Stable elevated left hemidiaphragm is noted with minimal left basilar subsegmental atelectasis. Right lung is clear. The visualized skeletal structures are unremarkable. IMPRESSION: Endotracheal and nasogastric tubes have been removed. Stable elevated left hemidiaphragm with minimal left basilar subsegmental atelectasis. Electronically Signed   By: Lupita Raider, M.D.   On: 07/14/2017 09:43   Dg Chest Port 1 View  Result Date: 07/13/2017 CLINICAL DATA:  Endotracheal tube placement. EXAM: PORTABLE CHEST 1 VIEW COMPARISON:  None. FINDINGS: The patient's endotracheal tube is seen ending 2 cm above the carina. An enteric tube is noted extending below the diaphragm, overlying the fundus of the stomach. This could be advanced approximately 5 cm. A small left pleural effusion is noted. Left basilar airspace opacity may reflect atelectasis or possibly infection. No pneumothorax is seen. The cardiomediastinal silhouette is unremarkable. No acute osseous abnormalities are identified. IMPRESSION: 1. Endotracheal tube seen ending 2 cm above the carina. 2. Enteric tube noted extending below the diaphragm, overlying the fundus of the stomach. This could be advanced approximately 5 cm, as deemed clinically appropriate. 3. Small left pleural  effusion noted. Left basilar airspace opacity may reflect atelectasis or possibly infection. Electronically Signed   By: Roanna Raider M.D.   On: 07/13/2017 04:20    Scheduled Meds: . chlorhexidine  15 mL Mouth Rinse BID  . enoxaparin (LOVENOX) injection  40 mg Subcutaneous Daily  . mouth rinse  15 mL Mouth Rinse q12n4p   Continuous Infusions: . ceFEPime (MAXIPIME) IV Stopped (07/14/17 0612)  . dextrose 5 % and 0.45 % NaCl with KCl 20 mEq/L 75 mL/hr at 07/14/17 1200    Assessment/Plan:  1. Clinical sepsis with healthcare associated pneumonia.  Started on cefepime and vancomycin.  Vancomycin discontinued and currently on cefepime.  Could also be aspiration pneumonia. 2. Acute hypoxic respiratory failure.  Patient was intubated in the emergency room.   extubated yesterday.  Patient on nasal cannula oxygen 3. End-stage dementia.   palliative care made the patient a DO NOT RESUSCITATE.  Recommend speech therapy to see if he can swallow. 4. Hematuria secondary to Foley trauma.  Consider discontinuing Lovenox. 5. History of hypertension.  Blood pressure currently elevated along with heart rate.  Can consider restarting medications if he is able to swallow.   Family Communication: As per critical care specialist Disposition Plan: To be determined  Consultants:  Critical care specialist  Palliative care  Antibiotics:  Cefepime  Time spent:  24 minutes  Deandrea Vanpelt [employer]

## 2017-07-14 NOTE — Evaluation (Addendum)
Clinical/Bedside Swallow Evaluation Patient Details  Name: Isaac Boyle MRN: [id number] Date of Birth: 1945/05/17  Today's Date: 07/14/2017 Time: SLP Start Time (ACUTE ONLY): 1400 SLP Stop Time (ACUTE ONLY): 1500 SLP Time Calculation (min) (ACUTE ONLY): 60 min  Past Medical History: History reviewed. No pertinent past medical history. Past Surgical History: History reviewed. No pertinent surgical history. HPI:  Pt is a 72 y.o. male with a known history of Alzheimer's dementia who presents from [facility] w/ SOB/respiratory failure. Pt's two sons and wife at bedside. Pt was apparently diagnosed w/ pneumonia by CXR during the day, and was started on Levaquin. Pt's wife states she was called at 0330AM on Wednesday 07/13/2017, was informed that pt tachypneic/hypoxemic, EMS called for respiratory distress and non-reassuring breathing. Intubated in ED. Tachycardic, BP stable. SIRS (+). Diffusely diminished breath sounds B/L (L > R), coarse expiratory breath sounds, bibasilar fine crackles on lung exam. Appears thin and chronically-ill. Pt apparently non-verbal, bedbound w/ poor functional status at baseline. Family states pt on pureed diet for dysphagia at facility. Pt is unable to verbalize his oral diet at the facility. At admission, pt was intubated emergently for the patient's own safety given high clinical suspicion for pneumonia and impending respiratory failure. Intubated without difficulty, and during intubation, I noted a thick amount of purulent material coming from his vocal cords. Pt was extubated by end of the same day w/out difficulty.     Assessment / Plan / Recommendation Clinical Impression  Pt appears to present w/ moderate risk for aspiration secondary to oropharyngeal phase dysphagia and declined Cognitive status, Dementia. Pt has a baseline of Dysphagia per report and was on a dysphagia diet at the facility. Pt was intermittently engaged w/ SLP but often just looked around; did  not follow any commands. Upon moving pt more upright in bed and turning onto his back, pt began coughing w/ mod-severe amount of pharyngeal-tracheal phlegm noise noted - pt did not expectorate any of the phlegm. SLP was able to suction min amount using the yaunker placed at base of tongue but only a min amount. With increased coughing, pt appeared uncomfortable w/ the increased phlegm w/ decline HR/RR and O2 sats. NSG consulted to monitor pt's status at that point. NSG reported RT attempted deep suctioning earlier in the morning but was unsuccessful. No oral diet is recommended at this time d/t the increased tracheal-pharyngeal phlegm and the increased risk for aspiration of secretions if po's were added. ST will f/u w/ pt's status next 1-2 days for appropriateness for po trials, oral intake. NSG agreed.  SLP Visit Diagnosis: Dysphagia, oropharyngeal phase (R13.12)    Aspiration Risk  Severe aspiration risk;Risk for inadequate nutrition/hydration    Diet Recommendation  NPO w/ frequent oral care for hygiene and oral stimulation for swallowing; strict aspiration precautions  Medication Administration: Via alternative means    Other  Recommendations Recommended Consults: (Dietician; RT) Oral Care Recommendations: Oral care QID;Staff/trained caregiver to provide oral care(strict aspiration precautions) Other Recommendations: (TBD)   Follow up Recommendations (TBD)      Frequency and Duration min 3x week  2 weeks       Prognosis Prognosis for Safe Diet Advancement: Guarded Barriers to Reach Goals: Cognitive deficits;Severity of deficits      Swallow Study   General Date of Onset: 07/13/17 HPI: Pt is a 72 y.o. male with a known history of Alzheimer's dementia who presents from [facility] w/ SOB/respiratory failure. Pt's two sons and wife at bedside.  Pt was apparently diagnosed w/ pneumonia by CXR during the day, and was started on Levaquin. Pt's wife states she was called at 0330AM on  Wednesday 07/13/2017, was informed that pt tachypneic/hypoxemic, EMS called for respiratory distress and non-reassuring breathing. Intubated in ED. Tachycardic, BP stable. SIRS (+). Diffusely diminished breath sounds B/L (L > R), coarse expiratory breath sounds, bibasilar fine crackles on lung exam. Appears thin and chronically-ill. Pt apparently non-verbal, bedbound w/ poor functional status at baseline. Family states pt on pureed diet for dysphagia at facility. Pt is unable to verbalize his oral diet at the facility.  Type of Study: Bedside Swallow Evaluation Previous Swallow Assessment: yes; on a dysphagia diet(puree noted) Diet Prior to this Study: NPO Temperature Spikes Noted: (wnc 11.6;  temp 99.1) Respiratory Status: Nasal cannula(4 liters) History of Recent Intubation: No Behavior/Cognition: Confused;Distractible;Requires cueing;Doesn't follow directions(fetal position; looking around but nonverbal) Oral Cavity Assessment: Erythema;Dry;Excessive secretions(soreness) Oral Care Completed by SLP: Yes Oral Cavity - Dentition: Edentulous Vision: (n/a) Self-Feeding Abilities: Total assist Patient Positioning: Upright in bed;Postural control adequate for testing Baseline Vocal Quality: (nonverbal) Volitional Cough: Congested(involuntary) Volitional Swallow: Unable to elicit    Oral/Motor/Sensory Function Overall Oral Motor/Sensory Function: Moderate impairment(unable to fully assess; open mouth posture at rest)   Ice Chips Ice chips: Not tested   Thin Liquid Thin Liquid: Not tested    Nectar Thick Nectar Thick Liquid: Not tested   Honey Thick Honey Thick Liquid: Not tested   Puree Puree: Not tested   Solid   GO   Solid: Not tested         Jerilynn Som, MS, CCC-SLP Stesha Neyens 07/14/2017,3:27 PM

## 2017-07-14 NOTE — Consult Note (Signed)
Consultation Note Date: 07/14/2017   Patient Name: Isaac Boyle  DOB: [birth year]  MRN: [id number]  Age / Sex: 72 y.o., male  PCP: Alvester Morin, MD Referring Physician: Loletha Grayer, MD  Reason for Consultation: Establishing goals of care  HPI/Patient Profile: 72 y.o. male  admitted on 07/13/2017 from [facility] with shortness of breath and respiratory failure. He has a past medical history significant for hypertension and Alzheimer's dementia. Pt's two sons and wife at bedside during ED course. Pt was apparently diagnosed w/ pneumonia by CXR during the day, and was started on Levaquin. Pt's wife states she was called at 13AM on Wednesday 07/13/2017, and  informed that pt tachypneic/hypoxemic, EMS called for respiratory distress and non-reassuring breathing. During ED course he was intubated, tachycardic, and SIRS (+). He had diffusely diminished breath sounds B/L (L > R), coarse expiratory breath sounds, bibasilar fine crackles on lung exam. Appears thin and chronically-ill. Pt apparently non-verbal, bedbound w/ poor functional status at baseline. Family states pt on pureed diet for dysphagia at facility. Labwork (+) mild hypokalemia (3.4), hyperglycemia (174), leukocytosis (12.4). CXR (+) "Small left pleural effusion noted. Left basilar airspace opacity may reflect atelectasis or possibly infection." He was admitted for sepsis 2/2 suspect acute bacterial healthcare acquired pneumonia, w/ acute hypoxemic respiratory failure. He has successfully been extubated and maintaining oxygen saturations on 4L/nasal cannula. Palliative Medicine consulted for goals of care discussion.    Clinical Assessment and Goals of Care: I have reviewed medical records including lab results, imaging, Epic notes, and MAR, received report from the bedside RN, and assessed the patient. I then met at the bedside with wife,  Mariann Laster and 2 sons, Vincente Liberty and Will to discuss diagnosis prognosis, GOC, EOL wishes, disposition and options.  I introduced Palliative Medicine as specialized medical care for people living with serious illness. It focuses on providing relief from the symptoms and stress of a serious illness. The goal is to improve quality of life for both the patient and the family.  We discussed a brief life review of the patient. Family reports he is a man of [place]. He is very independent and loved working in the yard. He also loved football. He has the 2 sons.   As far as functional and nutritional status. Family reports [facility] due to dementia and falls. He has been bed bound now for several years. His appetite has been good, and he would often eat seconds until about 3-4 months ago. Family has noticed a decline in his appetite and more coughing and gagging while eating. He requires total assistance at the facility including feeding.   We discussed his current illness and what it means in the larger context of his on-going co-morbidities.  Natural disease trajectory and expectations at EOL were discussed. Family was tearful when discussing his recognizable decline. Wife states he would never want to live in this state, and sons agreed. We discussed the risk of aspiration, and how they have noticed  prior to admission he would cough and gag with feedings.   I attempted to elicit values and goals of care important to the patient.    The difference between aggressive medical intervention and comfort care was considered in light of the patient's goals of care. At this time patient's family would like to continue to treat the treatable. We discussed comfort measures and what this would look like if they chose to shift care. This would include discontinuing aggressive treatments such as IVF, antibiotics, lab work, radiology testing and focusing more on the patient while treating  his symptoms more aggressively. This would include symptoms such as nausea, vomiting, agitation, anxiety, shortness of breath, and pain. Also allowing him to have comfort feedings with knowing the risk of aspiration. Family verbalized understanding and would like to watchfully wait over the next 24 hours to see how he does. They are not ready to shift to comfort but expressed they know this discussion was needed.   Advanced directives, concepts specific to code status, artifical feeding and hydration, and rehospitalization were considered and discussed. At this time family would like for patient to be DNR/DNI. They verbalized they know his time is coming near and would not want him to undergo intubation or CPR again. We discussed his nutritional status and the risk of aspiration. The family verbalized they were not interested in any forms of artificial feedings such as a PEG tube.   Hospice and Palliative Care services outpatient were explained and offered. Family is requesting outpatient hospice services once discharged. They are aware of hospice care philosophy and requirements.   Questions and concerns were addressed.  The family was encouraged to call with questions or concerns.  PMT will continue to support holistically.  HCPOA-Wanda Bernards (Wife)    SUMMARY OF RECOMMENDATIONS    DNR/DNI  Continue to treat the treatable. Watchful waiting to see how he will progress. Family considering shifting to comfort care if patient does not seem to get any better over the next 24-48hrs.  Would recommend SLP evaluation for risk of aspiration and safety with feeds.  Would recommend outpatient hospice services at discharge. Will wait to place actual order on 5/17 after following up with family. Want to be sure they are not considering shifting to full comfort or make any further decisions.   Palliative will continue to support patient, family, and medical team during hospitalization.   Code  Status/Advance Care Planning:  DNR/DNI at family's request   Palliative Prophylaxis:   Aspiration, Bowel Regimen, Delirium Protocol, Frequent Pain Assessment and Turn Reposition  Additional Recommendations (Limitations, Scope, Preferences):  Full Scope Treatment at family's request, continue to treat the treatable   Psycho-social/Spiritual:   Desire for further Chaplaincy support: NO   Prognosis:   Unable to determine-guarded to poor- in the setting of hypertension, end-stage alzheimers, decreased po intake, malnutrition, immobility, aspiration, bed bound, sepsis with pneumonia, and hypoxic respiratory failure.   Discharge Planning: To Be Determined      Primary Diagnoses: Present on Admission: . Healthcare associated bacterial pneumonia   I have reviewed the medical record, interviewed the patient and family, and examined the patient. The following aspects are pertinent.  History reviewed. No pertinent past medical history. Social History   Socioeconomic History  . Marital status: Married    Spouse name: Not on file  . Number of children: Not on file  . Years of education: Not on file  . Highest education level: Not on file  Occupational History  .  Not on file  Social Needs  . Financial resource strain: Not on file  . Food insecurity:    Worry: Not on file    Inability: Not on file  . Transportation needs:    Medical: Not on file    Non-medical: Not on file  Tobacco Use  . Smoking status: Never Smoker  . Smokeless tobacco: Never Used  Substance and Sexual Activity  . Alcohol use: Not Currently  . Drug use: Not Currently  . Sexual activity: Not Currently  Lifestyle  . Physical activity:    Days per week: Not on file    Minutes per session: Not on file  . Stress: Not on file  Relationships  . Social connections:    Talks on phone: Not on file    Gets together: Not on file    Attends religious service: Not on file    Active member of club or  organization: Not on file    Attends meetings of clubs or organizations: Not on file    Relationship status: Not on file  Other Topics Concern  . Not on file  Social History Narrative  . Not on file   History reviewed. No pertinent family history. Scheduled Meds: . chlorhexidine  15 mL Mouth Rinse BID  . enoxaparin (LOVENOX) injection  40 mg Subcutaneous Daily  . mouth rinse  15 mL Mouth Rinse q12n4p   Continuous Infusions: . ceFEPime (MAXIPIME) IV Stopped (07/14/17 0612)  . dextrose 5 % and 0.45 % NaCl with KCl 20 mEq/L 75 mL/hr at 07/14/17 1400   PRN Meds:.albuterol Medications Prior to Admission:  Prior to Admission medications   Medication Sig Start Date End Date Taking? Authorizing Provider  albuterol (ACCUNEB) 0.63 MG/3ML nebulizer solution Take 1 ampule by nebulization every 6 (six) hours as needed for wheezing.   Yes [provider]  carvedilol (COREG) 3.125 MG tablet Take 3.125 mg by mouth 2 (two) times daily with a meal.   Yes [provider]  levofloxacin (LEVAQUIN) 250 MG tablet Take 250 mg by mouth daily.   Yes [provider]  Melatonin 5 MG TABS Take 1 tablet by mouth at bedtime.   Yes [provider]  potassium chloride (K-DUR,KLOR-CON) 10 MEQ tablet Take 10 mEq by mouth daily.   Yes [provider]  psyllium (HYDROCIL/METAMUCIL) 95 % PACK Take 1 packet by mouth daily.   Yes [provider]   No Known Allergies Review of Systems  Unable to perform ROS: Dementia    Physical Exam  Constitutional: He has a sickly appearance.  Thin, fragile appearing   Cardiovascular: Normal heart sounds, intact distal pulses and normal pulses. An irregular rhythm present.  Pulmonary/Chest: He has decreased breath sounds. He has no rhonchi.  Shortness of breath   Abdominal: Soft. Bowel sounds are normal.  Genitourinary:  Genitourinary Comments: Bright red blood noted in catheter   Musculoskeletal:  bedbound   Neurological:  He is alert. He is disoriented. He displays atrophy.  Psychiatric: Cognition and memory are impaired. He expresses inappropriate judgment.  Nursing note and vitals reviewed.   Vital Signs: BP (!) 143/95   Pulse 100   Temp 98.4 F (36.9 C) (Axillary)   Resp 19   Ht '5\' 11"'$  (1.803 m)   Wt 62.8 kg (138 lb 7.2 oz)   SpO2 97%   BMI 19.31 kg/m  Pain Scale: PAINAD    SpO2: SpO2: 97 % O2 Device:SpO2: 97 % O2 Flow Rate: .O2 Flow Rate (L/min):  4 L/min  IO: Intake/output summary:   Intake/Output Summary (Last 24 hours) at 07/14/2017 1548 Last data filed at 07/14/2017 1400 Gross per 24 hour  Intake 608.75 ml  Output 1275 ml  Net -666.25 ml    LBM: Last BM Date: 07/14/17 Baseline Weight: Weight: 62.8 kg (138 lb 7.2 oz) Most recent weight: Weight: 62.8 kg (138 lb 7.2 oz)     Palliative Assessment/Data:PPS 20%   Time In: 1130 Time Out: 1300 Time Total: 90 min.   Greater than 50%  of this time was spent counseling and coordinating care related to the above assessment and plan.  Signed by: Alda Lea, NP-BC Palliative Medicine Team  Phone: [phone] Fax: [phone]   Please contact Palliative Medicine Team phone at [phone] for questions and concerns.  For individual provider: See Shea Evans

## 2017-07-15 LAB — CULTURE, RESPIRATORY W GRAM STAIN
Culture: NORMAL
Culture: NORMAL
Special Requests: NORMAL

## 2017-07-15 LAB — CULTURE, RESPIRATORY

## 2017-07-15 MED ORDER — ALBUTEROL SULFATE (2.5 MG/3ML) 0.083% IN NEBU
2.5000 mg | INHALATION_SOLUTION | Freq: Four times a day (QID) | RESPIRATORY_TRACT | Status: DC
  Administered 2017-07-15 – 2017-07-16 (×4): 2.5 mg via RESPIRATORY_TRACT
  Filled 2017-07-15 (×4): qty 3

## 2017-07-15 MED ORDER — GLYCOPYRROLATE 0.2 MG/ML IJ SOLN
0.1000 mg | INTRAMUSCULAR | Status: DC | PRN
  Administered 2017-07-15 – 2017-07-17 (×6): 0.1 mg via INTRAVENOUS
  Filled 2017-07-15 (×8): qty 0.5

## 2017-07-15 MED ORDER — METHYLPREDNISOLONE SODIUM SUCC 40 MG IJ SOLR
40.0000 mg | Freq: Every day | INTRAMUSCULAR | Status: DC
  Administered 2017-07-15 – 2017-07-18 (×4): 40 mg via INTRAVENOUS
  Filled 2017-07-15 (×4): qty 1

## 2017-07-15 MED ORDER — SCOPOLAMINE 1 MG/3DAYS TD PT72
1.0000 | MEDICATED_PATCH | TRANSDERMAL | Status: DC
  Administered 2017-07-15 – 2017-07-18 (×2): 1.5 mg via TRANSDERMAL
  Filled 2017-07-15 (×2): qty 1

## 2017-07-15 NOTE — Progress Notes (Signed)
Patient ID: MONTFORD BARG, male   DOB: 1945-11-21, 72 y.o.   MRN: [id number]   ACP note  Patient's wife and son at the bedside.  Along with the patient's sister.  Diagnosis: Clinical sepsis with healthcare associated pneumonia.  Aspiration pneumonia.  Acute hypoxic respiratory failure.  End-stage dementia.  Hematuria, history of hypertension, history of stroke, history of laryngeal cancer.  Plan.  The family would like to give it a few more days of antibiotic treatment to see if he improves.  Currently he is unable to swallow.  I do not think he is handling his secretions very well.  He is also having bloody secretions.  I started scopolamine patch and as needed glycopyrrolate.  Add Solu-Medrol.  Discontinue Lovenox.  Nebulizer treatments.  I discussed hospice home because if he does not eat and keep up with his nutritional needs he will not survive once I stop the IV fluids.  Time spent on ACP discussion 26 minutes  Dr. Alford Highland

## 2017-07-15 NOTE — Progress Notes (Addendum)
Patient ID: Isaac Boyle, male   DOB: Jan 02, 1946, 72 y.o.   MRN: [id number]  [employer] PROGRESS NOTE  Isaac Boyle:[id number] DOB: [birth year] DOA: 07/13/2017 PCP: Keane Police, MD  HPI/Subjective: Patient has his eyes open and looks at me.  Unable to answer questions today.  Gurgling on secretions when I saw him in the morning.  Needed to be suctioned.  Objective: Vitals:   07/15/17 0716 07/15/17 1314  BP: 138/72 (!) 123/94  Pulse: 97 (!) 105  Resp:  20  Temp:  99 F (37.2 C)  SpO2:  100%    Filed Weights   07/13/17 1100  Weight: 62.8 kg (138 lb 7.2 oz)    ROS: Review of Systems  Unable to perform ROS: Dementia   Exam: Physical Exam  Constitutional: He appears lethargic.  HENT:  Nose: No mucosal edema.  Mouth/Throat: No oropharyngeal exudate or posterior oropharyngeal edema.  Mouth is dry.  Suction canister did suction out blood.  Eyes: Pupils are equal, round, and reactive to light. Conjunctivae and lids are normal.  Neck: No JVD present. Carotid bruit is not present. No edema present. No thyroid mass and no thyromegaly present.  Cardiovascular: S1 normal and S2 normal. Exam reveals no gallop.  No murmur heard. Respiratory: No respiratory distress. He has decreased breath sounds in the right middle field, the right lower field, the left middle field and the left lower field. He has no wheezes. He has rhonchi in the right middle field, the right lower field, the left middle field and the left lower field. He has no rales.  GI: Soft. Bowel sounds are normal. There is no tenderness.  Musculoskeletal:       Right ankle: He exhibits no swelling.       Left ankle: He exhibits no swelling.  Lymphadenopathy:    He has no cervical adenopathy.  Neurological: He appears lethargic.  Skin: Skin is warm. No rash noted. Nails show no clubbing.  Psychiatric: His affect is blunt.      Data Reviewed: Basic Metabolic Panel: Recent Labs  Lab  07/13/17 0409 07/13/17 0635 07/14/17 0655  NA 140  --  141  K 3.4*  --  3.9  CL 104  --  108  CO2 24  --  23  GLUCOSE 174*  --  95  BUN 18  --  16  CREATININE 1.05  --  1.08  CALCIUM 9.7  --  9.3  MG  --  1.5*  --    Liver Function Tests: Recent Labs  Lab 07/13/17 0409  AST 28  ALT 14*  ALKPHOS 82  BILITOT 0.8  PROT 7.8  ALBUMIN 3.6   Recent Labs  Lab 07/13/17 0409  LIPASE 27   CBC: Recent Labs  Lab 07/13/17 0409 07/14/17 0655  WBC 12.4* 11.6*  NEUTROABS 10.3*  --   HGB 16.3 14.2  HCT 48.8 43.8  MCV 87.3 88.8  PLT 233 155   Cardiac Enzymes: Recent Labs  Lab 07/13/17 0409  TROPONINI 0.03*    CBG: Recent Labs  Lab 07/13/17 0616 07/13/17 2324  GLUCAP 144* 101*    Recent Results (from the past 240 hour(s))  Blood Culture (routine x 2)     Status: None (Preliminary result)   Collection Time: 07/13/17  4:09 AM  Result Value Ref Range Status   Specimen Description BLOOD RIGHT WRIST  Final   Special Requests   Final    BOTTLES DRAWN AEROBIC AND ANAEROBIC Blood Culture  results may not be optimal due to an excessive volume of blood received in culture bottles   Culture   Final    NO GROWTH 2 DAYS Performed at [facility], [address] [place], [place] [address]    Report Status PENDING  Incomplete  Blood Culture (routine x 2)     Status: None (Preliminary result)   Collection Time: 07/13/17  4:09 AM  Result Value Ref Range Status   Specimen Description BLOOD LEFT AC  Final   Special Requests   Final    BOTTLES DRAWN AEROBIC AND ANAEROBIC Blood Culture results may not be optimal due to an excessive volume of blood received in culture bottles   Culture   Final    NO GROWTH 2 DAYS Performed at [facility], [address], [place], [place] [address]    Report Status PENDING  Incomplete  Urine culture     Status: None   Collection Time: 07/13/17  4:09 AM  Result Value Ref Range Status   Specimen Description   Final     URINE, RANDOM Performed at [facility], [address] [place], [place] [address]    Special Requests   Final    NONE Performed at [facility], [address] [place], [place] [address]    Culture   Final    NO GROWTH Performed at [facility], [address] [place], [place] [address]    Report Status 07/14/2017 FINAL  Final  Culture, respiratory (NON-Expectorated)     Status: None   Collection Time: 07/13/17  4:10 AM  Result Value Ref Range Status   Specimen Description   Final    TRACHEAL ASPIRATE Performed at [facility], [address] [place], [place] [address]    Special Requests   Final    Normal Performed at [facility], [address] [place], [place] [address]    Gram Stain   Final    FEW WBC PRESENT, PREDOMINANTLY PMN RARE GRAM POSITIVE COCCI IN PAIRS RARE GRAM POSITIVE RODS    Culture   Final    Consistent with normal respiratory flora. Performed at [facility], [address] [place], [place] [address]    Report Status 07/15/2017 FINAL  Final  MRSA PCR Screening     Status: None   Collection Time: 07/13/17  7:33 AM  Result Value Ref Range Status   MRSA by PCR NEGATIVE NEGATIVE Final    Comment:        The GeneXpert MRSA Assay (FDA approved for NASAL specimens only), is one component of a comprehensive MRSA colonization surveillance program. It is not intended to diagnose MRSA infection nor to guide or monitor treatment for MRSA infections. Performed at [facility], [address] [place], [place] [address]   Culture, respiratory (NON-Expectorated)     Status: None   Collection Time: 07/13/17  9:25 AM  Result Value Ref Range Status   Specimen Description   Final    TRACHEAL ASPIRATE Performed at [facility], [address], [place], [place] [address]    Special Requests   Final    NONE Performed at [facility], [address] [place], [place]  [address]    Gram Stain   Final    RARE WBC PRESENT, PREDOMINANTLY PMN NO ORGANISMS SEEN    Culture   Final    Consistent with normal respiratory flora. Performed at [facility], [address] [place], [place] [address]    Report Status 07/15/2017 FINAL  Final     Studies: Dg Chest Port 1 View  Result Date: 07/14/2017 CLINICAL DATA:  Respiratory failure. EXAM: PORTABLE CHEST 1 VIEW COMPARISON:  Radiograph of Jul 13, 2017. FINDINGS: The heart size and mediastinal contours are within normal limits. No pneumothorax or pleural effusion is noted. Endotracheal and nasogastric tubes have been removed. Stable elevated left hemidiaphragm is noted with minimal left basilar subsegmental atelectasis. Right lung is clear. The visualized skeletal structures are unremarkable. IMPRESSION: Endotracheal and nasogastric tubes have been removed. Stable elevated left hemidiaphragm with minimal left basilar subsegmental atelectasis. Electronically Signed   By: Lupita Raider, M.D.   On: 07/14/2017 09:43    Scheduled Meds: . chlorhexidine  15 mL Mouth Rinse BID  . enoxaparin (LOVENOX) injection  40 mg Subcutaneous Daily  . mouth rinse  15 mL Mouth Rinse q12n4p  . scopolamine  1 patch Transdermal Q72H   Continuous Infusions: . ceFEPime (MAXIPIME) IV Stopped (07/15/17 0615)  . dextrose 5 % and 0.45 % NaCl with KCl 20 mEq/L 75 mL/hr at 07/15/17 1238    Assessment/Plan:  1. Clinical sepsis with healthcare associated pneumonia.  Started on cefepime and vancomycin.  Vancomycin discontinued and currently on cefepime.   this is aspiration pneumonia. 2. Acute hypoxic respiratory failure.  Patient was intubated in the emergency room And then extubated.  Patient on nasal cannula oxygen.  Patient having trouble managing his secretions.  Start on scopolamine patch and glycopyrrolate.  Suctioning as needed.   3. End-stage dementia.   palliative care made the patient a DO NOT RESUSCITATE.  Failed swallow  evaluation.  Family does not want a PEG tube.  Patient is a candidate for hospice home 4. Hematuria secondary to Foley trauma. 5. History of hypertension.  Blood pressure  little better today. 6. History of stroke as per family 7. History of laryngeal cancer as per family   Family Communication:  spoke with family at the bedside Disposition Plan:  family would like to give it a few more days prior to making a decision  Consultants:  Palliative care  Antibiotics:  Cefepime  Time spent:  40 minutes, including ACP time  [employer]

## 2017-07-15 NOTE — Progress Notes (Signed)
Daily Progress Note   Patient Name: Isaac Boyle       Date: 07/15/2017 DOB: August 08, 1945  Age: 72 y.o. MRN#: [id number] Attending Physician: Loletha Grayer, MD Primary Care Physician: Alvester Morin, MD Admit Date: 07/13/2017  Reason for Consultation/Follow-up: Establishing goals of care  Subjective: Patient is asleep in bed. Does not appear to be have discomfort. Family at bedside, wife, sons, and sister. I met with the wife and son. We discussed his current status and swallowing evaluation. Family verbalized understanding that Isaac Boyle is unable to safely take in food or liquids. We discussed in further details hospice options (facility with hospice versus residential hospice). At this time family would like to continue to watchfully wait to see if he makes any improvement. We re-discussed his disease trajectory and quality of life, including life expectancy without po intake. Family verbalized they were not interested in him having a PEG tube for feeding as they have experienced this in the past. We also discussed artificial feedings in dementia patients. Family was tearful, however verbalized understanding. They verbalized they would continue to discuss but would like to still allow time to observe and see if anything changes. If no changes over the next 48hrs they will then be prepared to make a decision to shift to full comfort.   Chart Reviewed.   Length of Stay: 2  Current Medications: Scheduled Meds:  . albuterol  2.5 mg Nebulization Q6H  . chlorhexidine  15 mL Mouth Rinse BID  . mouth rinse  15 mL Mouth Rinse q12n4p  . methylPREDNISolone (SOLU-MEDROL) injection  40 mg Intravenous Daily  . scopolamine  1 patch Transdermal Q72H    Continuous Infusions: . ceFEPime  (MAXIPIME) IV Stopped (07/15/17 0615)  . dextrose 5 % and 0.45 % NaCl with KCl 20 mEq/L 75 mL/hr at 07/15/17 1238    PRN Meds: glycopyrrolate, hydrALAZINE  Physical Exam   Constitutional: He has a sickly appearance.  Thin, fragile appearing   Cardiovascular: Normal heart sounds, intact distal pulses and normal pulses. An irregular rhythm present.  Pulmonary/Chest: He has decreased breath sounds. He has no rhonchi.  Scopolamine patch for secretions    Abdominal: Soft. Bowel sounds are normal.  Genitourinary:   Musculoskeletal:  bedbound   Neurological: He is alert. He is disoriented. He displays atrophy.  Psychiatric: Cognition  and memory are impaired. He expresses inappropriate judgment.  Nursing note and vitals reviewed.  Vital Signs: BP (!) 123/94 (BP Location: Right Arm)   Pulse (!) 105   Temp 99 F (37.2 C) (Axillary)   Resp 20   Ht _0  (1.803 m)   Wt 62.8 kg (138 lb 7.2 oz)   SpO2 100%   BMI 19.31 kg/m  SpO2: SpO2: 100 % O2 Device: O2 Device: Nasal Cannula O2 Flow Rate: O2 Flow Rate (L/min): 4 L/min  Intake/output summary:   Intake/Output Summary (Last 24 hours) at 07/15/2017 1603 Last data filed at 07/15/2017 0254 Gross per 24 hour  Intake 1005 ml  Output 625 ml  Net 380 ml   LBM: Last BM Date: 07/14/17 Baseline Weight: Weight: 62.8 kg (138 lb 7.2 oz) Most recent weight: Weight: 62.8 kg (138 lb 7.2 oz)       Palliative Assessment/Data:PPS 20%   Flowsheet Rows     Most Recent Value  Intake Tab  Referral Department  Hospitalist  Unit at Time of Referral  Med/Surg Unit  Palliative Care Primary Diagnosis  Neurology  Date Notified  07/14/17  Palliative Care Type  New Palliative care  Reason for referral  Clarify Goals of Care  Date of Admission  07/13/17  Date first seen by Palliative Care  07/14/17  # of days Palliative referral response time  0 Day(s)  # of days IP prior to Palliative referral  1  Clinical Assessment  Psychosocial & Spiritual  Assessment  Palliative Care Outcomes      Patient Active Problem List   Diagnosis Date Noted  . Healthcare associated bacterial pneumonia 07/13/2017  . Protein-calorie malnutrition, severe 07/13/2017    Palliative Care Assessment & Plan   Patient Profile: 72 y.o. male  admitted on 07/13/2017 from [facility] with shortness of breath and respiratory failure. He has a past medical history significant for hypertension and Alzheimer's dementia. Pt's two sons and wife at bedside during ED course. Pt was apparently diagnosed w/ pneumonia by CXR during the day, and was started on Levaquin. Pt's wife states she was called at 65AM on Wednesday 07/13/2017, and  informed that pt tachypneic/hypoxemic, EMS called for respiratory distress and non-reassuring breathing. During ED course he was intubated, tachycardic, and SIRS (+). He had diffusely diminished breath sounds B/L (L > R), coarse expiratory breath sounds, bibasilar fine crackles on lung exam. Appears thin and chronically-ill. Pt apparently non-verbal, bedbound w/ poor functional status at baseline. Family states pt on pureed diet for dysphagia at facility. Labwork (+) mild hypokalemia (3.4), hyperglycemia (174), leukocytosis (12.4). CXR (+) "Small left pleural effusion noted. Left basilar airspace opacity may reflect atelectasis or possibly infection." He was admitted for sepsis 2/2 suspect acute bacterial healthcare acquired pneumonia, w/ acute hypoxemic respiratory failure. He has successfully been extubated and maintaining oxygen saturations on 4L/nasal cannula. Palliative Medicine consulted for goals of care discussion.   Recommendations/Plan:  DNR/DNI  Continue to treat the treatable at family's request. Would like to watchfully wait over the next 24-48hrs to see if he has a change of status. If no improvement family is prepared to discuss hospice home versus back to [facility] with hospice services. Advised that he is hospice home  appropriate given his unresponsiveness, end-stage dementia, poor po intake, immobility. Family verbalized understanding.  Agree with scopolamine patch and robinul for secretions.   Palliative Medicine team will continue to support patient, family, and medical team during hospitalization.    Goals of  Care and Additional Recommendations:  Limitations on Scope of Treatment: Full Scope Treatment  Code Status:    Code Status Orders  (From admission, onward)        Start     Ordered   07/14/17 1129  Do not attempt resuscitation (DNR)  Continuous    Question Answer Comment  In the event of cardiac or respiratory ARREST Do not call a "code blue"   In the event of cardiac or respiratory ARREST Do not perform Intubation, CPR, defibrillation or ACLS   In the event of cardiac or respiratory ARREST Use medication by any route, position, wound care, and other measures to relive pain and suffering. May use oxygen, suction and manual treatment of airway obstruction as needed for comfort.      07/14/17 1128    Code Status History    Date Active Date Inactive Code Status Order ID Comments User Context   07/14/2017 0654 07/14/2017 1128 Full Code [id number]  Awilda Bill, NP Inpatient       Prognosis:   < 6 weeks guarded to poor- in the setting of hypertension, end-stage alzheimers, decreased po intake, malnutrition, immobility, aspiration, bed bound, sepsis with pneumonia, and hypoxic respiratory failure  Discharge Planning:  To Be Determined Residential hospice versus back to [facility] with hospice services.   Care plan was discussed with family, bedside RN.   Thank you for allowing the Palliative Medicine Team to assist in the care of this patient.   Total Time 45 min.  Prolonged Time Billed  No      Greater than 50%  of this time was spent counseling and coordinating care related to the above assessment and plan.  Alda Lea, NP Amion: N.Cousar  Please contact  Palliative Medicine Team phone at [phone] for questions and concerns.

## 2017-07-15 NOTE — Progress Notes (Signed)
Nutrition Follow Up Note   DOCUMENTATION CODES:   Severe malnutrition in context of chronic illness  INTERVENTION:   Recommend comfort feeds; RD will provide thickened supplements once diet advanced.   NUTRITION DIAGNOSIS:   Severe Malnutrition related to chronic illness(Alzheimer's disease, dysphagia, advanced age) as evidenced by severe fat depletion, moderate muscle depletion, severe muscle depletion.  GOAL:   Patient will meet greater than or equal to 90% of their needs  -not met- possible comfort feeds   MONITOR:   Diet advancement, Labs, Weight trends, Skin, I & O's  ASSESSMENT:   72 year old male with PMHx of Alzheimer's dementia, depression, constipation who was admitted from [facility] with acute hypoxic respiratory failure from PNA. Patient was initially placed on mechanical ventilation but was extubated <12 hours later on 5/15.   RD received consult for assessment for possible tube feeds. Spoke with Dietitian at [facility] who reports that pt has always been a good eater at baseline; pt eats a pureed diet and normally eats double portions. RD reports over the past two weeks, pt as been declining in functional status, not opening his mouth very wide, and only licking pureed food off of his lips. Pt relies heavily on assistance from nursing staff to eat and is not likely to meet his needs via oral intake. Pt currently NPO as recommended by speech; SLP plans to reassess pt today. Pt is not a good candidate for temporary NGT placement and feeds; RD does not feel like this would benefit pt. Per palliative care note, family reports they would not want PEG tube placement. Would recommend comfort feeds at this point. If family did change their minds and decide to proceed with G-tube placement, this can be placed in IR with fluoroscopy vs the need for GI involvement and EGD. RD can provide thickened supplements once diet advanced.   Medications reviewed and include: lovenox,  scopalamine, cefepime, NaCl w/ 5% dextrose _0 /hr  Labs reviewed:   Diet Order:   Diet Order           Diet NPO time specified  Diet effective now         EDUCATION NEEDS:   Not appropriate for education at this time  Skin:  Skin Assessment: Reviewed RN Assessment  Last BM:  5/16- type 5  Height:   Ht Readings from Last 1 Encounters:  07/13/17 5' 11" (1.803 m)    Weight:   Wt Readings from Last 1 Encounters:  07/13/17 138 lb 7.2 oz (62.8 kg)    Ideal Body Weight:  78.2 kg  BMI:  Body mass index is 19.31 kg/m.  Estimated Nutritional Needs:   Kcal:  3664-4034 (MSJ x 1.2-1.4)  Protein:  80-95 grams (1.3-1.5 grams/kg)  Fluid:  1.6-1.8 L/day (25-30 mL/kg)  Koleen Distance MS, RD, LDN Pager #- [phone] Office#- [phone] After Hours Pager: [phone]

## 2017-07-15 NOTE — Clinical Social Work Note (Signed)
Clinical Social Work Assessment  Patient Details  Name: Isaac Boyle MRN: [id number] Date of Birth: 1945/10/16  Date of referral:  07/15/17               Reason for consult:  Discharge Planning                Permission sought to share information with:    Permission granted to share information::     Name::        Agency::     Relationship::     Contact Information:     Housing/Transportation Living arrangements for the past 2 months:  Skilled Nursing Facility Source of Information:  Medical Team Patient Interpreter Needed:  None Criminal Activity/Legal Involvement Pertinent to Current Situation/Hospitalization:  No - Comment as needed Significant Relationships:    Lives with:    Do you feel safe going back to the place where you live?    Need for family participation in patient care:  Yes (Comment)  Care giving concerns:  Patient is a long term resident at [facility].   Social Worker assessment / plan:  Palliative Care has been following with patient and family and it was decided yesterday by family that they would not want to pursue a feeding tube, nor would they want him re-intubated or CPR performed.  Physician spoke with the family today and they wish to see if patient will improve over the next few days and if not they will consider comfort measures.  Patient is currently requiring suctioning and if this suctioning is required post discharge, it is very possible that the SNF will not be able to manage this.   Employment status:  Disabled (Comment on whether or not currently receiving Disability) Insurance information:  Medicare PT Recommendations:    Information / Referral to community resources:     Patient/Family's Response to care:  Patient's family are saddened by the path of patient's illness.   Patient/Family's Understanding of and Emotional Response to Diagnosis, Current Treatment, and Prognosis:  Patient's family are not yet ready to make patient comfort  care at this time.  Emotional Assessment Appearance:    Attitude/Demeanor/Rapport:    Affect (typically observed):    Orientation:    Alcohol / Substance use:  Not Applicable Psych involvement (Current and /or in the community):  No (Comment)  Discharge Needs  Concerns to be addressed:    Readmission within the last 30 days:    Current discharge risk:  None Barriers to Discharge:  Continued Medical Work up   [employer], LCSW 07/15/2017, 1:43 PM

## 2017-07-15 NOTE — Progress Notes (Signed)
Patient was resting.  Chaplain offered silent, energetic prayer.

## 2017-07-15 NOTE — NC FL2 (Signed)
  [place] MEDICAID FL2 LEVEL OF CARE SCREENING TOOL     IDENTIFICATION  Patient Name: Isaac Boyle Birthdate: 02-Sep-1945 Sex: male Admission Date (Current Location): 07/13/2017  [facility] and [place] Number:  Chiropodist and Address:  [facility], [address], [place], [place] [address]      Provider Number: [id number]  Attending Physician Name and Address:  Alford Highland, MD  Relative Name and Phone Number:       Current Level of Care: Hospital Recommended Level of Care: Skilled Nursing Facility Prior Approval Number:    Date Approved/Denied:   PASRR Number:    Discharge Plan: SNF    Current Diagnoses: Patient Active Problem List   Diagnosis Date Noted  . Healthcare associated bacterial pneumonia 07/13/2017  . Protein-calorie malnutrition, severe 07/13/2017    Orientation RESPIRATION BLADDER Height & Weight        O2(4 liters) Incontinent Weight: 138 lb 7.2 oz (62.8 kg) Height:   (180.3 cm)  BEHAVIORAL SYMPTOMS/MOOD NEUROLOGICAL BOWEL NUTRITION STATUS      Incontinent    AMBULATORY STATUS COMMUNICATION OF NEEDS Skin   Total Care Non-Verbally Normal                       Personal Care Assistance Level of Assistance  Total care       Total Care Assistance: Maximum assistance   Functional Limitations Info             SPECIAL CARE FACTORS FREQUENCY                       Contractures Contractures Info: Not present    Additional Factors Info                  Current Medications (07/15/2017):  This is the current hospital active medication list Current Facility-Administered Medications  Medication Dose Route Frequency Provider Last Rate Last Dose  . albuterol (PROVENTIL) (2.5 MG/3ML) 0.083% nebulizer solution 2.5 mg  2.5 mg Nebulization Q4H PRN Merwyn Katos, MD      . ceFEPIme (MAXIPIME) 2 g in sodium chloride 0.9 % 100 mL IVPB  2 g Intravenous Q12H Gardner Candle, RPH   Stopped  at 07/15/17 0615  . chlorhexidine (PERIDEX) 0.12 % solution 15 mL  15 mL Mouth Rinse BID Merwyn Katos, MD   15 mL at 07/15/17 1000  . dextrose 5 % and 0.45 % NaCl with KCl 20 mEq/L infusion   Intravenous Continuous Merwyn Katos, MD 75 mL/hr at 07/15/17 1238    . enoxaparin (LOVENOX) injection 40 mg  40 mg Subcutaneous Daily Barbaraann Rondo, MD   40 mg at 07/14/17 2307  . glycopyrrolate (ROBINUL) injection 0.1 mg  0.1 mg Intravenous Q4H PRN Alford Highland, MD   0.1 mg at 07/15/17 1237  . hydrALAZINE (APRESOLINE) injection 10 mg  10 mg Intravenous Q6H PRN Shaune Pollack, MD   10 mg at 07/15/17 0645  . MEDLINE mouth rinse  15 mL Mouth Rinse q12n4p Merwyn Katos, MD   15 mL at 07/14/17 1605  . scopolamine (TRANSDERM-SCOP) 1 MG/3DAYS 1.5 mg  1 patch Transdermal Q72H Alford Highland, MD   1.5 mg at 07/15/17 1237     Discharge Medications: Please see discharge summary for a list of discharge medications.  Relevant Imaging Results:  Relevant Lab Results:   Additional Information    York Spaniel, LCSW

## 2017-07-16 ENCOUNTER — Inpatient Hospital Stay: Payer: Medicare Other

## 2017-07-16 MED ORDER — ALBUTEROL SULFATE (2.5 MG/3ML) 0.083% IN NEBU: 2.5000 mg | INHALATION_SOLUTION | Freq: Four times a day (QID) | RESPIRATORY_TRACT | Status: DC | PRN

## 2017-07-16 MED ORDER — ENOXAPARIN SODIUM 40 MG/0.4ML ~~LOC~~ SOLN
40.0000 mg | SUBCUTANEOUS | Status: DC
  Administered 2017-07-16 – 2017-07-17 (×2): 40 mg via SUBCUTANEOUS
  Filled 2017-07-16 (×2): qty 0.4

## 2017-07-16 NOTE — Consult Note (Signed)
Pharmacy Antibiotic Note  Isaac Boyle is a 72 y.o. male admitted on 07/13/2017 with  pneumonia.  Pharmacy has been consulted for Cefepime dosing. Patient received cefepime 2g IV x 1 dose in ED   Plan: Continue Cefepime 2g IV every 12 hours  Height:  (180.3 cm) Weight: 138 lb 7.2 oz (62.8 kg) IBW/kg (Calculated) : 75.3  Temp (24hrs), Avg:98.8 F (37.1 C), Min:98.4 F (36.9 C), Max:99 F (37.2 C)  Recent Labs  Lab 07/13/17 0409 07/13/17 0635 07/14/17 0655  WBC 12.4*  --  11.6*  CREATININE 1.05  --  1.08  LATICACIDVEN 1.9 1.8  --     Estimated Creatinine Clearance: 55.7 mL/min (by C-G formula based on SCr of 1.08 mg/dL).    No Known Allergies  Antimicrobials this admission: 5/15 vancomycin >> x 1 dose 5/15 cefepime  >>   Microbiology results: 5/15 BCx: NG 5/15 UCx: NG 5/15 Sputum: normal flora 5/15 MRSA PCR: negative   Thank you for allowing pharmacy to be a part of this patient's care.  Olene Floss, PharmD, BCPS Clinical Pharmacist 07/16/2017 12:28 PM

## 2017-07-16 NOTE — Progress Notes (Signed)
Patient ID: Isaac Boyle, male   DOB: [birth year], 72 y.o.   MRN: [id number]  [employer] PROGRESS NOTE  BONNY EGGER XBJ:[id number] DOB: [birth year] DOA: 07/13/2017 PCP: Keane Police, MD  HPI/Subjective: Patient is lethargic.  Seen by speech therapy who is recommending to keep the patient n.p.o. patient is unable to answer questions today.  Audible coarse breath sounds.  Wife and son at bedside.  Seen by palliative care yesterday  Objective: Vitals:   07/16/17 0459 07/16/17 1307  BP: 95/82 122/78  Pulse: 97 74  Resp: 20 16  Temp: 98.4 F (36.9 C) 98.6 F (37 C)  SpO2: 100% 100%    Filed Weights   07/13/17 1100  Weight: 62.8 kg (138 lb 7.2 oz)    ROS: Review of Systems  Unable to perform ROS: Dementia   Exam: Physical Exam  Constitutional: He appears lethargic.  HENT:  Nose: No mucosal edema.  Mouth/Throat: No oropharyngeal exudate or posterior oropharyngeal edema.  Mouth is dry.  Suction canister did suction out blood.  Eyes: Pupils are equal, round, and reactive to light. Conjunctivae and lids are normal.  Neck: No JVD present. Carotid bruit is not present. No edema present. No thyroid mass and no thyromegaly present.  Cardiovascular: S1 normal and S2 normal. Exam reveals no gallop.  No murmur heard. Respiratory: No respiratory distress. He has decreased breath sounds in the right middle field, the right lower field, the left middle field and the left lower field. He has no wheezes. He has rhonchi in the right middle field, the right lower field, the left middle field and the left lower field. He has no rales.  GI: Soft. Bowel sounds are normal. There is no tenderness.  Musculoskeletal:       Right ankle: He exhibits no swelling.       Left ankle: He exhibits no swelling.  Lymphadenopathy:    He has no cervical adenopathy.  Neurological: He appears lethargic.  Skin: Skin is warm. No rash noted. Nails show no clubbing.  Psychiatric: His affect is  blunt.      Data Reviewed: Basic Metabolic Panel: Recent Labs  Lab 07/13/17 0409 07/13/17 0635 07/14/17 0655  NA 140  --  141  K 3.4*  --  3.9  CL 104  --  108  CO2 24  --  23  GLUCOSE 174*  --  95  BUN 18  --  16  CREATININE 1.05  --  1.08  CALCIUM 9.7  --  9.3  MG  --  1.5*  --    Liver Function Tests: Recent Labs  Lab 07/13/17 0409  AST 28  ALT 14*  ALKPHOS 82  BILITOT 0.8  PROT 7.8  ALBUMIN 3.6   Recent Labs  Lab 07/13/17 0409  LIPASE 27   CBC: Recent Labs  Lab 07/13/17 0409 07/14/17 0655  WBC 12.4* 11.6*  NEUTROABS 10.3*  --   HGB 16.3 14.2  HCT 48.8 43.8  MCV 87.3 88.8  PLT 233 155   Cardiac Enzymes: Recent Labs  Lab 07/13/17 0409  TROPONINI 0.03*    CBG: Recent Labs  Lab 07/13/17 0616 07/13/17 2324  GLUCAP 144* 101*    Recent Results (from the past 240 hour(s))  Blood Culture (routine x 2)     Status: None (Preliminary result)   Collection Time: 07/13/17  4:09 AM  Result Value Ref Range Status   Specimen Description BLOOD RIGHT WRIST  Final   Special Requests   Final  BOTTLES DRAWN AEROBIC AND ANAEROBIC Blood Culture results may not be optimal due to an excessive volume of blood received in culture bottles   Culture   Final    NO GROWTH 3 DAYS Performed at [facility], [address] [place], [place] [address]    Report Status PENDING  Incomplete  Blood Culture (routine x 2)     Status: None (Preliminary result)   Collection Time: 07/13/17  4:09 AM  Result Value Ref Range Status   Specimen Description BLOOD LEFT AC  Final   Special Requests   Final    BOTTLES DRAWN AEROBIC AND ANAEROBIC Blood Culture results may not be optimal due to an excessive volume of blood received in culture bottles   Culture   Final    NO GROWTH 3 DAYS Performed at [facility], [address] [place], [place] [address]    Report Status PENDING  Incomplete  Urine culture     Status: None   Collection Time: 07/13/17  4:09  AM  Result Value Ref Range Status   Specimen Description   Final    URINE, RANDOM Performed at [facility], [address] [place], [place] [address]    Special Requests   Final    NONE Performed at [facility], [address], [place], [place] [address]    Culture   Final    NO GROWTH Performed at [facility], [address], [place], [place] [address]    Report Status 07/14/2017 FINAL  Final  Culture, respiratory (NON-Expectorated)     Status: None   Collection Time: 07/13/17  4:10 AM  Result Value Ref Range Status   Specimen Description   Final    TRACHEAL ASPIRATE Performed at [facility], [address] [place], [place] [address]    Special Requests   Final    Normal Performed at [facility], [address] [place], [place] [address]    Gram Stain   Final    FEW WBC PRESENT, PREDOMINANTLY PMN RARE GRAM POSITIVE COCCI IN PAIRS RARE GRAM POSITIVE RODS    Culture   Final    Consistent with normal respiratory flora. Performed at [facility], [address], [place], [place] [address]    Report Status 07/15/2017 FINAL  Final  MRSA PCR Screening     Status: None   Collection Time: 07/13/17  7:33 AM  Result Value Ref Range Status   MRSA by PCR NEGATIVE NEGATIVE Final    Comment:        The GeneXpert MRSA Assay (FDA approved for NASAL specimens only), is one component of a comprehensive MRSA colonization surveillance program. It is not intended to diagnose MRSA infection nor to guide or monitor treatment for MRSA infections. Performed at [facility], [address] [place], [place] [address]   Culture, respiratory (NON-Expectorated)     Status: None   Collection Time: 07/13/17  9:25 AM  Result Value Ref Range Status   Specimen Description   Final    TRACHEAL ASPIRATE Performed at [facility], [address] [place], [place] [address]    Special Requests   Final     NONE Performed at [facility], [address] [place], [place] [address]    Gram Stain   Final    RARE WBC PRESENT, PREDOMINANTLY PMN NO ORGANISMS SEEN    Culture   Final    Consistent with normal respiratory flora.  Performed at [facility], [address], [place], [place] [address]    Report Status 07/15/2017 FINAL  Final     Studies: No results found.  Scheduled Meds: . albuterol  2.5 mg Nebulization Q6H  . chlorhexidine  15 mL Mouth Rinse BID  . enoxaparin (LOVENOX) injection  40 mg Subcutaneous Q24H  . mouth rinse  15 mL Mouth Rinse q12n4p  . methylPREDNISolone (SOLU-MEDROL) injection  40 mg Intravenous Daily  . scopolamine  1 patch Transdermal Q72H   Continuous Infusions: . ceFEPime (MAXIPIME) IV 2 g (07/16/17 0610)  . dextrose 5 % and 0.45 % NaCl with KCl 20 mEq/L 75 mL/hr at 07/16/17 0118    Assessment/Plan:  1. 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  Patient is strict n.p.o. after seen by speech therapy as per the recommendations, he is at high risk for aspiration, clinically deteriorating; palliative care is following and according to them prognosis is less than 6 weeks if patient clinically does not improve 2. clinical sepsis with healthcare associated pneumonia with aspiration initially patient was on cefepime and vancomycin.  Vancomycin discontinued and currently on cefepime.   Repeat chest x-ray ordered 3. Acute hypoxic respiratory failure.  Patient was intubated in the emergency room and then extubated.  Patient on nasal cannula oxygen.  Patient having trouble managing his secretions.  Start on scopolamine patch and glycopyrrolate.  Suctioning as needed.   4. End-stage dementia.   palliative care made the patient a DO NOT RESUSCITATE.  Failed swallow evaluation.  Family does not want a PEG tube.  Patient is a candidate for hospice home 5. Hematuria secondary to Foley trauma. 6. History of hypertension.   Blood pressure  little better today. 7. History of stroke as per family 8. 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

## 2017-07-16 NOTE — Progress Notes (Signed)
Speech Language Pathology Dysphagia Treatment Patient Details Name: Isaac Boyle MRN: [id number] DOB: 21-Sep-1945 Today's Date: 07/16/2017 Time: 8295-6213 SLP Time Calculation (min) (ACUTE ONLY): 26 min  Assessment / Plan / Recommendation Clinical Impression    pt continues to present with a severe to profound oral pharyngeal dysphagia characterized by an inability to trigger an effective swallow with max verbal, visual and tactile cues presented. Pt utilize several strategies to initiate a swallow. Family was present for session and was educated on aspiration pna and safe swallow strategies. Family stated "We know he can't swallow". When presented with empty spoon pt did close oral cavity around spoon but did not initiate swallow reflex. NPO recommended. NSG and family educated and verbally agree to plan. SLP will continue to follow up for safe PO trials.     Diet Recommendation    NPO   SLP Plan   Continue with current plan of care  Pertinent Vitals/Pain None reported    Swallowing Goals     General Behavior/Cognition: Alert;Confused;Impulsive;Lethargic/Drowsy Patient Positioning: Upright in bed HPI: Pt is a 72 y.o. male with a known history of Alzheimer's dementia who presents from [facility] w/ SOB/respiratory failure. Pt's two sons and wife at bedside. Pt was apparently diagnosed w/ pneumonia by CXR during the day, and was started on Levaquin. Pt's wife states she was called at 0330AM on Wednesday 07/13/2017, was informed that pt tachypneic/hypoxemic, EMS called for respiratory distress and non-reassuring breathing. Intubated in ED. Tachycardic, BP stable. SIRS (+). Diffusely diminished breath sounds B/L (L > R), coarse expiratory breath sounds, bibasilar fine crackles on lung exam. Appears thin and chronically-ill. Pt apparently non-verbal, bedbound w/ poor functional status at baseline. Family states pt on pureed diet for dysphagia at facility. Pt is unable to verbalize his oral  diet at the facility.   Oral Cavity - Oral Hygiene     Dysphagia Treatment Treatment Methods: Skilled observation;Upgraded PO texture trial;Patient/caregiver education Patient observed directly with PO's: Yes Type of PO's observed: Dysphagia 1 (puree) Feeding: Total assist Liquids provided via: Teaspoon Oral Phase Signs & Symptoms: Right pocketing;Anterior loss/spillage;Prolonged mastication;Oral holding Pharyngeal Phase Signs & Symptoms: Suspected delayed swallow initiation;Delayed cough Type of cueing: Verbal;Tactile;Visual Amount of cueing: Total   GO     Meredith Pel Sauber 07/16/2017, 10:47 AM

## 2017-07-16 NOTE — Progress Notes (Signed)
Respiratory present to do nasotracheal suctioning to patient. Patient tolerated well. Isaac Boyle

## 2017-07-16 NOTE — Progress Notes (Signed)
Family at the bedside all day.  Questions answered by myself and the MD

## 2017-07-16 NOTE — Progress Notes (Signed)
NTS per family request.  Moderate to large return of thick white secretions.  Pt tolerated poorly with agitation.

## 2017-07-16 NOTE — Progress Notes (Signed)
Patient NTS per family request.  Small to moderate return of thick clear/white secretions.  Tolerated fairly well.

## 2017-07-17 MED ORDER — GLYCOPYRROLATE 0.2 MG/ML IJ SOLN
0.2000 mg | INTRAMUSCULAR | Status: DC | PRN
  Administered 2017-07-17 – 2017-07-19 (×4): 0.2 mg via INTRAVENOUS
  Filled 2017-07-17 (×3): qty 1

## 2017-07-17 MED ORDER — MORPHINE SULFATE (PF) 2 MG/ML IV SOLN
2.0000 mg | INTRAVENOUS | Status: DC | PRN
  Administered 2017-07-17 (×2): 2 mg via INTRAVENOUS
  Filled 2017-07-17 (×2): qty 1

## 2017-07-17 MED ORDER — METOPROLOL TARTRATE 5 MG/5ML IV SOLN
5.0000 mg | Freq: Four times a day (QID) | INTRAVENOUS | Status: DC | PRN
  Administered 2017-07-17 – 2017-07-19 (×4): 5 mg via INTRAVENOUS
  Filled 2017-07-17 (×4): qty 5

## 2017-07-17 MED ORDER — MORPHINE SULFATE (PF) 2 MG/ML IV SOLN
2.0000 mg | INTRAVENOUS | Status: DC | PRN
Start: 2017-07-17 — End: 2017-07-18
  Administered 2017-07-17 – 2017-07-18 (×3): 2 mg via INTRAVENOUS
  Filled 2017-07-17 (×3): qty 1

## 2017-07-17 MED ORDER — LORAZEPAM 2 MG/ML IJ SOLN
1.0000 mg | INTRAMUSCULAR | Status: DC | PRN
  Administered 2017-07-17 – 2017-07-19 (×3): 1 mg via INTRAVENOUS
  Filled 2017-07-17 (×3): qty 1

## 2017-07-17 NOTE — Progress Notes (Signed)
Family was concerned that the patient was anxious.  Order received from Dr Amado Coe for ativan.  Morphine changed to every 3 hours PRN

## 2017-07-17 NOTE — Progress Notes (Signed)
Patient is slowly declining.  Family at bedside all day since arriving this am.  BP and heart rate have been elevated.  Lopressor PRN helped both

## 2017-07-17 NOTE — Progress Notes (Signed)
BP and heart rate elevated.  Order received from Dr Amado Coe to discontinue hydralazine and lopressor ordered

## 2017-07-17 NOTE — Progress Notes (Signed)
Patient ID: Isaac Boyle, male   DOB: [birth year], 72 y.o.   MRN: [id number]  [employer] PROGRESS NOTE  Isaac Boyle:[id number] DOB: 02/04/1946 DOA: 07/13/2017 PCP: Keane Police, MD  HPI/Subjective: Patient is not arousable with gurgling breath sounds probably death rattle seen by speech therapy who is recommending to keep the patient n.p.o. patient is unable to answer questions today.    Wife and son at bedside.  Seen by palliative care on Friday  Objective: Vitals:   07/17/17 1253 07/17/17 1339  BP: (!) 146/96 (!) 158/82  Pulse: (!) 112 (!) 124  Resp:    Temp:    SpO2:      Filed Weights   07/13/17 1100  Weight: 62.8 kg (138 lb 7.2 oz)    ROS: Review of Systems  Unable to perform ROS: Dementia   Exam: Physical Exam  Constitutional: He appears lethargic.  HENT:  Nose: No mucosal edema.  Mouth/Throat: No oropharyngeal exudate or posterior oropharyngeal edema.  Mouth is dry.  Suction canister did suction out blood.  Eyes: Pupils are equal, round, and reactive to light. Conjunctivae and lids are normal.  Neck: No JVD present. Carotid bruit is not present. No edema present. No thyroid mass and no thyromegaly present.  Cardiovascular: S1 normal and S2 normal. Exam reveals no gallop.  No murmur heard. Respiratory: No respiratory distress. He has decreased breath sounds in the right middle field, the right lower field, the left middle field and the left lower field. He has no wheezes. He has rhonchi in the right middle field, the right lower field, the left middle field and the left lower field. He has no rales.  GI: Soft. Bowel sounds are normal. There is no tenderness.  Musculoskeletal:       Right ankle: He exhibits no swelling.       Left ankle: He exhibits no swelling.  Lymphadenopathy:    He has no cervical adenopathy.  Neurological: He appears lethargic.  Skin: Skin is warm. No rash noted. Nails show no clubbing.  Psychiatric: His affect is  blunt.      Data Reviewed: Basic Metabolic Panel: Recent Labs  Lab 07/13/17 0409 07/13/17 0635 07/14/17 0655  NA 140  --  141  K 3.4*  --  3.9  CL 104  --  108  CO2 24  --  23  GLUCOSE 174*  --  95  BUN 18  --  16  CREATININE 1.05  --  1.08  CALCIUM 9.7  --  9.3  MG  --  1.5*  --    Liver Function Tests: Recent Labs  Lab 07/13/17 0409  AST 28  ALT 14*  ALKPHOS 82  BILITOT 0.8  PROT 7.8  ALBUMIN 3.6   Recent Labs  Lab 07/13/17 0409  LIPASE 27   CBC: Recent Labs  Lab 07/13/17 0409 07/14/17 0655  WBC 12.4* 11.6*  NEUTROABS 10.3*  --   HGB 16.3 14.2  HCT 48.8 43.8  MCV 87.3 88.8  PLT 233 155   Cardiac Enzymes: Recent Labs  Lab 07/13/17 0409  TROPONINI 0.03*    CBG: Recent Labs  Lab 07/13/17 0616 07/13/17 2324  GLUCAP 144* 101*    Recent Results (from the past 240 hour(s))  Blood Culture (routine x 2)     Status: None (Preliminary result)   Collection Time: 07/13/17  4:09 AM  Result Value Ref Range Status   Specimen Description BLOOD RIGHT WRIST  Final   Special Requests  Final    BOTTLES DRAWN AEROBIC AND ANAEROBIC Blood Culture results may not be optimal due to an excessive volume of blood received in culture bottles   Culture   Final    NO GROWTH 4 DAYS Performed at [facility], [address] [place], [place] [address]    Report Status PENDING  Incomplete  Blood Culture (routine x 2)     Status: None (Preliminary result)   Collection Time: 07/13/17  4:09 AM  Result Value Ref Range Status   Specimen Description BLOOD LEFT AC  Final   Special Requests   Final    BOTTLES DRAWN AEROBIC AND ANAEROBIC Blood Culture results may not be optimal due to an excessive volume of blood received in culture bottles   Culture   Final    NO GROWTH 4 DAYS Performed at [facility], [address], [place], [place] [address]    Report Status PENDING  Incomplete  Urine culture     Status: None   Collection Time: 07/13/17  4:09  AM  Result Value Ref Range Status   Specimen Description   Final    URINE, RANDOM Performed at [facility], [address] [place], [place] [address]    Special Requests   Final    NONE Performed at [facility], [address], [place], [place] [address]    Culture   Final    NO GROWTH Performed at [facility], [address], [place], [place] [address]    Report Status 07/14/2017 FINAL  Final  Culture, respiratory (NON-Expectorated)     Status: None   Collection Time: 07/13/17  4:10 AM  Result Value Ref Range Status   Specimen Description   Final    TRACHEAL ASPIRATE Performed at [facility], [address], [place], [place] [address]    Special Requests   Final    Normal Performed at [facility], [address] [place], [place] [address]    Gram Stain   Final    FEW WBC PRESENT, PREDOMINANTLY PMN RARE GRAM POSITIVE COCCI IN PAIRS RARE GRAM POSITIVE RODS    Culture   Final    Consistent with normal respiratory flora. Performed at [facility], [address] [place], [place] [address]    Report Status 07/15/2017 FINAL  Final  MRSA PCR Screening     Status: None   Collection Time: 07/13/17  7:33 AM  Result Value Ref Range Status   MRSA by PCR NEGATIVE NEGATIVE Final    Comment:        The GeneXpert MRSA Assay (FDA approved for NASAL specimens only), is one component of a comprehensive MRSA colonization surveillance program. It is not intended to diagnose MRSA infection nor to guide or monitor treatment for MRSA infections. Performed at [facility], [address] [place], [place] [address]   Culture, respiratory (NON-Expectorated)     Status: None   Collection Time: 07/13/17  9:25 AM  Result Value Ref Range Status   Specimen Description   Final    TRACHEAL ASPIRATE Performed at [facility], [address] [place], [place] [address]    Special Requests   Final     NONE Performed at [facility], [address] [place], [place] [address]    Gram Stain   Final    RARE WBC PRESENT, PREDOMINANTLY PMN NO ORGANISMS SEEN    Culture   Final    Consistent  with normal respiratory flora. Performed at [facility], [address] [place], [place] [address]    Report Status 07/15/2017 FINAL  Final     Studies: Dg Chest Port 1 View  Result Date: 07/16/2017 CLINICAL DATA:  Shortness of breath EXAM: PORTABLE CHEST 1 VIEW COMPARISON:  Two days ago FINDINGS: Elevated left diaphragm with overlying streaky density that is mildly increased. No air bronchogram, effusion, or pneumothorax. Normal heart size and mediastinal contours. IMPRESSION: Mild increase in atelectasis over the elevated left diaphragm. Electronically Signed   By: Marnee Spring M.D.   On: 07/16/2017 13:51    Scheduled Meds: . chlorhexidine  15 mL Mouth Rinse BID  . enoxaparin (LOVENOX) injection  40 mg Subcutaneous Q24H  . mouth rinse  15 mL Mouth Rinse q12n4p  . methylPREDNISolone (SOLU-MEDROL) injection  40 mg Intravenous Daily  . scopolamine  1 patch Transdermal Q72H   Continuous Infusions: . ceFEPime (MAXIPIME) IV Stopped (07/17/17 0551)  . dextrose 5 % and 0.45 % NaCl with KCl 20 mEq/L 75 mL/hr at 07/16/17 1436    Assessment/Plan:  1. 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  Patient is strict n.p.o. after seen by speech therapy as per their recommendations, he is at high risk for aspiration, clinically deteriorating; palliative care is following and according to them prognosis is less than 6 weeks if patient clinically does not improve No improvement noticed over the weekend 2. clinical sepsis with healthcare associated pneumonia with aspiration initially patient was on cefepime and vancomycin.  Vancomycin discontinued and currently on cefepime.   Repeat chest x-ray -mild increase in atelectasis  3. acute hypoxic respiratory  failure.  Patient was intubated in the emergency room and then extubated.  Patient on nasal cannula oxygen.  Patient having trouble managing his secretions.   on scopolamine patch and glycopyrrolate dose increased.  Suctioning as needed.   4. End-stage dementia.   palliative care made the patient a DO NOT RESUSCITATE.  Failed swallow evaluation.  Family does not want a PEG tube.  Patient is a candidate for hospice home 5. Hematuria secondary to Foley trauma. 6. History of hypertension.  Blood pressure  little better today. 7. History of stroke as per family 8. 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  Sinus tachycardia: IV fluids and discontinued hydralazine IV Lopressor as needed  Family Communication:  spoke with family wife and son at the bedside Disposition Plan:  family would like to give it a few more days prior to making a decision, hospice might be a good idea.  Anticipating hospice home tomorrow if family is agreeable  Consultants:  Palliative care  Antibiotics:  Cefepime  Time spent: 28 minutes  Deanna Artis Jericha Bryden  [employer]

## 2017-07-18 LAB — CULTURE, BLOOD (ROUTINE X 2)
CULTURE: NO GROWTH
CULTURE: NO GROWTH

## 2017-07-18 MED ORDER — ONDANSETRON 4 MG PO TBDP: 4.0000 mg | ORAL_TABLET | Freq: Four times a day (QID) | ORAL | Status: DC | PRN

## 2017-07-18 MED ORDER — MORPHINE SULFATE (PF) 2 MG/ML IV SOLN: 2.0000 mg | INTRAVENOUS | Status: DC | PRN

## 2017-07-18 MED ORDER — MORPHINE SULFATE (PF) 2 MG/ML IV SOLN
2.0000 mg | INTRAVENOUS | Status: DC | PRN
  Administered 2017-07-18 – 2017-07-19 (×2): 2 mg via INTRAVENOUS
  Filled 2017-07-18 (×2): qty 1

## 2017-07-18 MED ORDER — ACETAMINOPHEN 325 MG PO TABS: 650.0000 mg | ORAL_TABLET | Freq: Four times a day (QID) | ORAL | Status: DC | PRN

## 2017-07-18 MED ORDER — ONDANSETRON HCL 4 MG/2ML IJ SOLN: 4.0000 mg | Freq: Four times a day (QID) | INTRAMUSCULAR | Status: DC | PRN

## 2017-07-18 MED ORDER — POLYVINYL ALCOHOL 1.4 % OP SOLN
1.0000 [drp] | Freq: Four times a day (QID) | OPHTHALMIC | Status: DC | PRN
  Filled 2017-07-18: qty 15

## 2017-07-18 MED ORDER — ACETAMINOPHEN 650 MG RE SUPP: 650.0000 mg | Freq: Four times a day (QID) | RECTAL | Status: DC | PRN

## 2017-07-18 NOTE — Progress Notes (Signed)
New hospice home referral received from [facility] following a [facility] follow up family meeting. Patient is a 72 year old man with a past medical history of Alzheimer's dementia and HTN who was admitted to [facility]  from [facility] on 5/15 with shortness of breath and respiratory failure. He required intubation and received IV antibiotics. He was extubated on 5/16 to 4 liters nasal cannula. Palliative Medicine was consulted for goals of care as patient has continued with poor oral intake and poor prognosis. Family decided to focus on comfort with transfer to the hospice home. Writer met in the room with patient's wife Mariann Laster and sons Vincente Liberty and Javien to initiate education regarding hospice services, philosophy and team approach to care with good understanding voiced. Questions answered, consents signed. Plan is for transport to the hospice home tomorrow via EMS with signed DNR in place. Patient information faxed to referral. Hospital care team updated. Writer to arrange  EMS transportation on 5/21.  Flo Shanks RN, BSN, [facility], hospital Liaison [phone]

## 2017-07-18 NOTE — Clinical Social Work Note (Signed)
CSW was informed that palliative is recommending hospice home.  [facility] [place], CSW contacted [facility] nurse liasion and they can accept patient on Tuesday.  CSW updated [facility] that patient will be going to the hospice home tomorrow, as long as he is medically stable for discharge and orders have been received.  Ervin Knack. Hassan Rowan, MSW, [facility] [phone]  07/18/2017 3:51 PM\

## 2017-07-18 NOTE — Progress Notes (Signed)
Patient ID: Isaac Boyle, male   DOB: 06/13/1945, 72 y.o.   MRN: [id number]  [employer] PROGRESS NOTE  Isaac Boyle:[id number] DOB: [birth year] DOA: 07/13/2017 PCP: Keane Police, MD  HPI/Subjective: Patient is not arousable with gurgling breath sounds probably death rattle  Wife and son has requested comfort care measures and hospice home  Objective: Vitals:   07/18/17 0657 07/18/17 1356  BP: (!) 126/94 (!) 115/98  Pulse: (!) 101 (!) 120  Resp:  18  Temp:  99.1 F (37.3 C)  SpO2:  99%    Filed Weights   07/13/17 1100  Weight: 62.8 kg (138 lb 7.2 oz)    ROS: Review of Systems  Unable to perform ROS: Dementia   Exam: Physical Exam  Constitutional: He appears lethargic.  HENT:  Nose: No mucosal edema.  Mouth/Throat: No oropharyngeal exudate or posterior oropharyngeal edema.  Mouth is dry.  Suction canister did suction out blood.  Eyes: Pupils are equal, round, and reactive to light. Conjunctivae and lids are normal.  Neck: No JVD present. Carotid bruit is not present. No edema present. No thyroid mass and no thyromegaly present.  Cardiovascular: S1 normal and S2 normal. Exam reveals no gallop.  No murmur heard. Respiratory: No respiratory distress. He has decreased breath sounds in the right middle field, the right lower field, the left middle field and the left lower field. He has no wheezes. He has rhonchi in the right middle field, the right lower field, the left middle field and the left lower field. He has no rales.  GI: Soft. Bowel sounds are normal. There is no tenderness.  Musculoskeletal:       Right ankle: He exhibits no swelling.       Left ankle: He exhibits no swelling.  Lymphadenopathy:    He has no cervical adenopathy.  Neurological: He appears lethargic.  Skin: Skin is warm. No rash noted. Nails show no clubbing.  Psychiatric: His affect is blunt.      Data Reviewed: Basic Metabolic Panel: Recent Labs  Lab 07/13/17 0409  07/13/17 0635 07/14/17 0655  NA 140  --  141  K 3.4*  --  3.9  CL 104  --  108  CO2 24  --  23  GLUCOSE 174*  --  95  BUN 18  --  16  CREATININE 1.05  --  1.08  CALCIUM 9.7  --  9.3  MG  --  1.5*  --    Liver Function Tests: Recent Labs  Lab 07/13/17 0409  AST 28  ALT 14*  ALKPHOS 82  BILITOT 0.8  PROT 7.8  ALBUMIN 3.6   Recent Labs  Lab 07/13/17 0409  LIPASE 27   CBC: Recent Labs  Lab 07/13/17 0409 07/14/17 0655  WBC 12.4* 11.6*  NEUTROABS 10.3*  --   HGB 16.3 14.2  HCT 48.8 43.8  MCV 87.3 88.8  PLT 233 155   Cardiac Enzymes: Recent Labs  Lab 07/13/17 0409  TROPONINI 0.03*    CBG: Recent Labs  Lab 07/13/17 0616 07/13/17 2324  GLUCAP 144* 101*    Recent Results (from the past 240 hour(s))  Blood Culture (routine x 2)     Status: None   Collection Time: 07/13/17  4:09 AM  Result Value Ref Range Status   Specimen Description BLOOD RIGHT WRIST  Final   Special Requests   Final    BOTTLES DRAWN AEROBIC AND ANAEROBIC Blood Culture results may not be optimal due to an  excessive volume of blood received in culture bottles   Culture   Final    NO GROWTH 5 DAYS Performed at [facility], [address] [place], [place] [address]    Report Status 07/18/2017 FINAL  Final  Blood Culture (routine x 2)     Status: None   Collection Time: 07/13/17  4:09 AM  Result Value Ref Range Status   Specimen Description BLOOD LEFT AC  Final   Special Requests   Final    BOTTLES DRAWN AEROBIC AND ANAEROBIC Blood Culture results may not be optimal due to an excessive volume of blood received in culture bottles   Culture   Final    NO GROWTH 5 DAYS Performed at [facility], [address] [place], [place] [address]    Report Status 07/18/2017 FINAL  Final  Urine culture     Status: None   Collection Time: 07/13/17  4:09 AM  Result Value Ref Range Status   Specimen Description   Final    URINE, RANDOM Performed at [facility],  [address], [place], [place] [address]    Special Requests   Final    NONE Performed at [facility], [address] [place], [place] [address]    Culture   Final    NO GROWTH Performed at [facility], [address] [place], [place] [address]    Report Status 07/14/2017 FINAL  Final  Culture, respiratory (NON-Expectorated)     Status: None   Collection Time: 07/13/17  4:10 AM  Result Value Ref Range Status   Specimen Description   Final    TRACHEAL ASPIRATE Performed at [facility], [address], [place], [place] [address]    Special Requests   Final    Normal Performed at [facility], [address] [place], [place] [address]    Gram Stain   Final    FEW WBC PRESENT, PREDOMINANTLY PMN RARE GRAM POSITIVE COCCI IN PAIRS RARE GRAM POSITIVE RODS    Culture   Final    Consistent with normal respiratory flora. Performed at [facility], [address], [place], [place] [address]    Report Status 07/15/2017 FINAL  Final  MRSA PCR Screening     Status: None   Collection Time: 07/13/17  7:33 AM  Result Value Ref Range Status   MRSA by PCR NEGATIVE NEGATIVE Final    Comment:        The GeneXpert MRSA Assay (FDA approved for NASAL specimens only), is one component of a comprehensive MRSA colonization surveillance program. It is not intended to diagnose MRSA infection nor to guide or monitor treatment for MRSA infections. Performed at [facility], [address] [place], [place] [address]   Culture, respiratory (NON-Expectorated)     Status: None   Collection Time: 07/13/17  9:25 AM  Result Value Ref Range Status   Specimen Description   Final    TRACHEAL ASPIRATE Performed at [facility], [address] [place], [place] [address]    Special Requests   Final    NONE Performed at [facility], [address] [place], [place] [address]    Gram Stain   Final    RARE WBC PRESENT,  PREDOMINANTLY PMN NO ORGANISMS SEEN    Culture   Final    Consistent with normal respiratory flora. Performed at [facility], [address] [place], [place] [address]  Report Status 07/15/2017 FINAL  Final     Studies: No results found.  Scheduled Meds: . mouth rinse  15 mL Mouth Rinse q12n4p  . scopolamine  1 patch Transdermal Q72H   Continuous Infusions: . dextrose 5 % and 0.45 % NaCl with KCl 20 mEq/L 75 mL/hr at 07/18/17 0358    Assessment/Plan:  1. 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  Patient is strict n.p.o.clinically deteriorating; palliative care is following and according to them prognosis is less than 6 weeks if patient clinically does not improve No improvement noticed over the weekend.  Family has decided to make him comfort care and requesting to transfer him to hospice home 2. clinical sepsis with healthcare associated pneumonia with aspiration initially patient was on cefepime and vancomycin.     Repeat chest x-ray -mild increase in atelectasis  3. acute hypoxic respiratory failure.  Patient was intubated in the emergency room and then extubated.  Patient on nasal cannula oxygen.  on scopolamine patch and glycopyrrolate dose increased.  Suctioning as needed.   4. End-stage dementia.   Hematuria secondary to Foley trauma. 5. History of hypertension.   6. 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  Sinus tachycardia:  Family Communication:  spoke with family wife and son at the bedside Disposition Plan: Hospice home tomorrow  Consultants:  Palliative care   Time spent: 28 minutes  Deanna Artis Sophiah Rolin  [employer]

## 2017-07-18 NOTE — Progress Notes (Signed)
Daily Progress Note   Patient Name: Isaac Boyle       Date: 07/18/2017 DOB: Feb 03, 1946  Age: 72 y.o. MRN#: [id number] Attending Physician: Nicholes Mango, MD Primary Care Physician: Alvester Morin, MD Admit Date: 07/13/2017  Reason for Consultation/Follow-up: Establishing goals of care  Subjective: Patient is lethargic. Does not appear to be in any discomfort. Family at bedside, wife, son, and sister. I met with the wife and son. We discussed his current status and continuous signs decline. He is currently unresponsive.  Family was tearful, however verbalized understanding. At this time family has decided that transitioning patient to hospice home and full comfort while hospitalized would be most appropriate and meet the end of life needs for Isaac Boyle.  Again we discussed hospice home in their philosophy.  They are aware that life expectancy is 2 weeks or less, and that patient will continue to be treated with respect and dignity while focusing aggressively on any symptoms he may have.  We discussed the viral process and they are aware that they will be contacted by Santiago Glad, RN hospice liaison and at that time she will discuss in details admission criteria and bed availability.  Family was very appreciative of services over the past week and support given during this process.  Chart Reviewed and report received by bedside RN.   Length of Stay: 5  Current Medications: Scheduled Meds:  . chlorhexidine  15 mL Mouth Rinse BID  . mouth rinse  15 mL Mouth Rinse q12n4p  . scopolamine  1 patch Transdermal Q72H    Continuous Infusions: . dextrose 5 % and 0.45 % NaCl with KCl 20 mEq/L 75 mL/hr at 07/18/17 0358    PRN Meds: albuterol, glycopyrrolate, LORazepam, metoprolol tartrate, morphine  injection  Physical Exam   Constitutional: He is lethargic and appears to be actively dying.   Thin, fragile appearing   Cardiovascular: Normal heart sounds, weak distal pulses.  An irregular rhythm present.  Pulmonary/Chest: He has decreased breath sounds. Rhonchi.  Scopolamine patch for secretions    Abdominal: Soft. Bowel sounds are normal.  Genitourinary:   Musculoskeletal:  bedbound   Neurological: He is lethargic. He displays atrophy.  Psychiatric: Cognition and memory are impaired. He expresses inappropriate judgment.  Nursing note and vitals reviewed.  Vital Signs: BP (!) 126/94 (BP Location: Left  Arm) Comment: post med  Pulse (!) 101   Temp 97.9 F (36.6 C) (Oral)   Resp (!) 24   Ht '5\' 11"'$  (1.803 m)   Wt 62.8 kg (138 lb 7.2 oz)   SpO2 99%   BMI 19.31 kg/m  SpO2: SpO2: 99 % O2 Device: O2 Device: Nasal Cannula O2 Flow Rate: O2 Flow Rate (L/min): 4 L/min  Intake/output summary:   Intake/Output Summary (Last 24 hours) at 07/18/2017 1315 Last data filed at 07/18/2017 2637 Gross per 24 hour  Intake 1237.5 ml  Output 900 ml  Net 337.5 ml   LBM: Last BM Date: 07/16/17 Baseline Weight: Weight: 62.8 kg (138 lb 7.2 oz) Most recent weight: Weight: 62.8 kg (138 lb 7.2 oz)       Palliative Assessment/Data:PPS 10%   Flowsheet Rows     Most Recent Value  Intake Tab  Referral Department  Hospitalist  Unit at Time of Referral  Med/Surg Unit  Palliative Care Primary Diagnosis  Neurology  Date Notified  07/14/17  Palliative Care Type  New Palliative care  Reason for referral  Clarify Goals of Care  Date of Admission  07/13/17  Date first seen by Palliative Care  07/14/17  # of days Palliative referral response time  0 Day(s)  # of days IP prior to Palliative referral  1  Clinical Assessment  Psychosocial & Spiritual Assessment  Palliative Care Outcomes      Patient Active Problem List   Diagnosis Date Noted  . Healthcare associated bacterial pneumonia 07/13/2017   . Protein-calorie malnutrition, severe 07/13/2017    Palliative Care Assessment & Plan   Patient Profile: 72 y.o. male  admitted on 07/13/2017 from [facility] with shortness of breath and respiratory failure. He has a past medical history significant for hypertension and Alzheimer's dementia. Pt's two sons and wife at bedside during ED course. Pt was apparently diagnosed w/ pneumonia by CXR during the day, and was started on Levaquin. Pt's wife states she was called at 67AM on Wednesday 07/13/2017, and  informed that pt tachypneic/hypoxemic, EMS called for respiratory distress and non-reassuring breathing. During ED course he was intubated, tachycardic, and SIRS (+). He had diffusely diminished breath sounds B/L (L > R), coarse expiratory breath sounds, bibasilar fine crackles on lung exam. Appears thin and chronically-ill. Pt apparently non-verbal, bedbound w/ poor functional status at baseline. Family states pt on pureed diet for dysphagia at facility. Labwork (+) mild hypokalemia (3.4), hyperglycemia (174), leukocytosis (12.4). CXR (+) "Small left pleural effusion noted. Left basilar airspace opacity may reflect atelectasis or possibly infection." He was admitted for sepsis 2/2 suspect acute bacterial healthcare acquired pneumonia, w/ acute hypoxemic respiratory failure. He has successfully been extubated and maintaining oxygen saturations on 4L/nasal cannula. Palliative Medicine consulted for goals of care discussion.   Recommendations/Plan:  DNR/DNI  FULL COMFORT MEASURES as requested by family   Scopolamine patch scheduled and Robinul IV PRN for secretions.  Morphine IV every hour PRN for pain and/or shortness of breath.  Ativan IV every 4 hours PRN agitation and anxiety.  Zofran SL/IV every 4 hours PRN as needed for nausea/vomiting.  CSW consult for hospice home placement.  Santiago Glad, RN [facility]) has been notified.  Palliative Medicine team will continue to support  patient, family, and medical team during hospitalization.    Goals of Care and Additional Recommendations:  Limitations on Scope of Treatment: Full Scope Treatment  Code Status:    Code Status Orders  (From admission, onward)  Start     Ordered   07/14/17 1129  Do not attempt resuscitation (DNR)  Continuous    Question Answer Comment  In the event of cardiac or respiratory ARREST Do not call a "code blue"   In the event of cardiac or respiratory ARREST Do not perform Intubation, CPR, defibrillation or ACLS   In the event of cardiac or respiratory ARREST Use medication by any route, position, wound care, and other measures to relive pain and suffering. May use oxygen, suction and manual treatment of airway obstruction as needed for comfort.      07/14/17 1128    Code Status History    Date Active Date Inactive Code Status Order ID Comments User Context   07/14/2017 0654 07/14/2017 1128 Full Code [id number]  Awilda Bill, NP Inpatient       Prognosis:   < 2 weeks guarded to poor- in the setting of hypertension, end-stage alzheimers, decreased po intake, malnutrition, immobility, aspiration, bed bound, sepsis with pneumonia, and hypoxic respiratory failure  Discharge Planning:  Hospice facility  Care plan was discussed with family, bedside RN, Santiago Glad, RN (hospice liaison ), Dr Margaretmary Eddy, and CSW.   Thank you for allowing the Palliative Medicine Team to assist in the care of this patient.   Total Time 35 min.  Prolonged Time Billed  No      Greater than 50%  of this time was spent counseling and coordinating care related to the above assessment and plan.  Alda Lea, NP Amion: N.Cousar  Please contact Palliative Medicine Team phone at [phone] for questions and concerns.

## 2017-07-18 NOTE — Progress Notes (Signed)
  Speech Language Pathology Treatment: Dysphagia  Patient Details Name: Isaac Boyle MRN: [id number] DOB: 1945-09-04 Today's Date: 07/18/2017 Time: 4782-9562 SLP Time Calculation (min) (ACUTE ONLY): 45 min  Assessment / Plan / Recommendation Clinical Impression  Pt seen for ongoing assessment for safety and toleration of any oral intake.Pt is having issues w/ HR, rattling breath sounds. Pt does arouse to mod-max verbal/tatctile stim. Noted open-mouth posture at rest. Pt has been NPO. Followed by Palliative Care.  Oral care performed; oral sensitivity was noted - SLP was careful w/ the oral care. Pt was given 1/2 TSP amounts of Honey consistency liquids placed anteriorly in the mouth. Pt attempted lingual manipulation w/ rolling and extension movements but no A-P transfer was noted - NO pharyngeal swallow was appreciated. Each trial required oral suctioning by yaunker to remove bolus material from his mouth. After few attempts, no further po trials attempted; no further consistencies attempted d/t high risk for choking/aspiration. Recommend continue frequent oral care for hygiene and stimulation. Recommend f/u w/ Palliative Care for goals of care. NSG/MD updated. ST services can be available for further po trials and education if pt's medical status improves for safe oral intake. Family given education on this session and recommendations.     HPI HPI: Pt is a 72 y.o. male with a known history of Alzheimer's dementia who presents from [facility] w/ SOB/respiratory failure. Pt's two sons and wife at bedside. Pt was apparently diagnosed w/ pneumonia by CXR during the day, and was started on Levaquin. Pt's wife states she was called at 0330AM on Wednesday 07/13/2017, was informed that pt tachypneic/hypoxemic, EMS called for respiratory distress and non-reassuring breathing. Intubated in ED. Tachycardic, BP stable. SIRS (+). Diffusely diminished breath sounds B/L (L > R), coarse expiratory breath  sounds, bibasilar fine crackles on lung exam. Appears thin and chronically-ill. Pt apparently non-verbal, bedbound w/ poor functional status at baseline. Family states pt on pureed diet for dysphagia at facility. Pt is unable to verbalize; open-mouth posture at rest. Family stated pt has not been responsive to them.       SLP Plan  Continue with current plan of care;Consult other service (comment)(Palliative Care following)       Recommendations  Diet recommendations: NPO Medication Administration: Via alternative means Postural Changes and/or Swallow Maneuvers: Seated upright 90 degrees(w/ oral care)                General recommendations: (palliative care f/u) Oral Care Recommendations: Oral care QID;Staff/trained caregiver to provide oral care(aspiration precautions) Follow up Recommendations: (TBD) SLP Visit Diagnosis: Dysphagia, oropharyngeal phase (R13.12) Plan: Continue with current plan of care;Consult other service (comment)(Palliative Care following)       GO                Isaac Som, MS, CCC-SLP Isaac Boyle 07/18/2017, 11:03 AM

## 2017-07-19 DIAGNOSIS — J9690 Respiratory failure, unspecified, unspecified whether with hypoxia or hypercapnia: Secondary | ICD-10-CM

## 2017-07-19 MED ORDER — ONDANSETRON 4 MG PO TBDP: 4.0000 mg | ORAL_TABLET | Freq: Four times a day (QID) | ORAL | 0 refills | Status: AC | PRN

## 2017-07-19 MED ORDER — MORPHINE SULFATE (PF) 2 MG/ML IV SOLN: 2.0000 mg | INTRAVENOUS | 0 refills | Status: AC | PRN

## 2017-07-19 MED ORDER — SCOPOLAMINE 1 MG/3DAYS TD PT72: 1.0000 | MEDICATED_PATCH | TRANSDERMAL | 12 refills | Status: AC

## 2017-07-19 MED ORDER — GLYCOPYRROLATE 0.2 MG/ML IJ SOLN: 0.2000 mg | INTRAMUSCULAR | Status: AC | PRN

## 2017-07-19 MED ORDER — METOPROLOL TARTRATE 5 MG/5ML IV SOLN: 5.0000 mg | Freq: Four times a day (QID) | INTRAVENOUS | Status: AC | PRN

## 2017-07-19 MED ORDER — LORAZEPAM 2 MG/ML IJ SOLN: 1.0000 mg | INTRAMUSCULAR | 0 refills | Status: AC | PRN

## 2017-07-19 MED ORDER — ALBUTEROL SULFATE (2.5 MG/3ML) 0.083% IN NEBU: 2.5000 mg | INHALATION_SOLUTION | Freq: Four times a day (QID) | RESPIRATORY_TRACT | 12 refills | Status: AC | PRN

## 2017-07-19 MED ORDER — POLYVINYL ALCOHOL 1.4 % OP SOLN: 1.0000 [drp] | Freq: Four times a day (QID) | OPHTHALMIC | 0 refills | Status: AC | PRN

## 2017-07-19 NOTE — Progress Notes (Signed)
Daily Progress Note   Patient Name: Isaac Boyle       Date: 07/19/2017 DOB: 1945-07-22  Age: 72 y.o. MRN#: [id number] Attending Physician: Isaac Lab, MD Primary Care Physician: Isaac Police, MD Admit Date: 07/13/2017  Reason for Consultation/Follow-up: Establishing goals of care  Subjective: Patient is unresponsive. Opens eye at times. Some facial grimacing and arms movement noted. Nurse preparing to administer medications for pain/agitation. No family at the bedside, however updates provided over phone. Patient will be transported via EMS to hospice home [facility] today. I provided mouthcare at bedside for comfort. Family does not have any questions at this time. Isaac Braun, RN [facility]) also at the bedside.   Chart Reviewed and report received by bedside RN.   Length of Stay: 6  Current Medications: Scheduled Meds:  . mouth rinse  15 mL Mouth Rinse q12n4p  . scopolamine  1 patch Transdermal Q72H    Continuous Infusions: . dextrose 5 % and 0.45 % NaCl with KCl 20 mEq/L 25 mL/hr at 07/18/17 1837    PRN Meds: albuterol, glycopyrrolate, LORazepam, metoprolol tartrate, morphine injection, ondansetron **OR** ondansetron (ZOFRAN) IV, polyvinyl alcohol  Physical Exam  HENT:  Mouth/Throat: Mucous membranes are dry.  Oral mucosa dry, mouth breathing.      Constitutional: He is lethargic and appears to be actively dying.   Thin, fragile appearing   Cardiovascular: Normal heart sounds, weak distal pulses.  An irregular rhythm present.  Pulmonary/Chest: He has decreased breath sounds. Rhonchi.  Scopolamine patch for secretions    Abdominal: Soft. Bowel sounds are normal.  Genitourinary:   Musculoskeletal:  bedbound   Neurological: Unresponsive. He displays  atrophy.  Psychiatric: Cognition and memory are impaired. He expresses inappropriate judgment.  Nursing note and vitals reviewed.  Vital Signs: BP (!) 151/109 (BP Location: Right Arm)   Pulse (!) 140   Temp 98.9 F (37.2 C) (Oral)   Resp 20   Ht  (1.803 m)   Wt 62.8 kg (138 lb 7.2 oz)   SpO2 99%   BMI 19.31 kg/m  SpO2: SpO2: 99 % O2 Device: O2 Device: Nasal Cannula O2 Flow Rate: O2 Flow Rate (L/min): 4 L/min  Intake/output summary:   Intake/Output Summary (Last 24 hours) at 07/19/2017 1028 Last data filed at 5IMIR BRUMBACH0 Gross per 24 hour  Intake  1198.89 ml  Output -  Net 1198.89 ml   LBM: Last BM Date: 07/16/17 Baseline Weight: Weight: 62.8 kg (138 lb 7.2 oz) Most recent weight: Weight: 62.8 kg (138 lb 7.2 oz)       Palliative Assessment/Data:PPS 10%   Flowsheet Rows     Most Recent Value  Intake Tab  Referral Department  Hospitalist  Unit at Time of Referral  Med/Surg Unit  Palliative Care Primary Diagnosis  Neurology  Date Notified  07/14/17  Palliative Care Type  New Palliative care  Reason for referral  Clarify Goals of Care  Date of Admission  07/13/17  Date first seen by Palliative Care  07/14/17  # of days Palliative referral response time  0 Day(s)  # of days IP prior to Palliative referral  1  Clinical Assessment  Psychosocial & Spiritual Assessment  Palliative Care Outcomes      Patient Active Problem List   Diagnosis Date Noted  . Healthcare associated bacterial pneumonia 07/13/2017  . Protein-calorie malnutrition, severe 07/13/2017    Palliative Care Assessment & Plan   Patient Profile: 72 y.o. male  admitted on 07/13/2017 from [facility] with shortness of breath and respiratory failure. He has a past medical history significant for hypertension and Alzheimer's dementia. Pt's two sons and wife at bedside during ED course. Pt was apparently diagnosed w/ pneumonia by CXR during the day, and was started on Levaquin. Pt's wife states  she was called at 0330AM on Wednesday 07/13/2017, and  informed that pt tachypneic/hypoxemic, EMS called for respiratory distress and non-reassuring breathing. During ED course he was intubated, tachycardic, and SIRS (+). He had diffusely diminished breath sounds B/L (L > R), coarse expiratory breath sounds, bibasilar fine crackles on lung exam. Appears thin and chronically-ill. Pt apparently non-verbal, bedbound w/ poor functional status at baseline. Family states pt on pureed diet for dysphagia at facility. Labwork (+) mild hypokalemia (3.4), hyperglycemia (174), leukocytosis (12.4). CXR (+) "Small left pleural effusion noted. Left basilar airspace opacity may reflect atelectasis or possibly infection." He was admitted for sepsis 2/2 suspect acute bacterial healthcare acquired pneumonia, w/ acute hypoxemic respiratory failure. He has successfully been extubated and maintaining oxygen saturations on 4L/nasal cannula. Palliative Medicine consulted for goals of care discussion.   Recommendations/Plan:  DNR/DNI  FULL COMFORT MEASURES as requested by family. Will transport to [facility] today. Family is aware.   Scopolamine patch scheduled and Robinul IV PRN for secretions.  Morphine IV every hour PRN for pain and/or shortness of breath.  Ativan IV every 4 hours PRN agitation and anxiety.  Zofran SL/IV every 4 hours PRN as needed for nausea/vomiting.     Goals of Care and Additional Recommendations:  Limitations on Scope of Treatment: Full Comfort Care and No Artificial Feeding  Code Status:    Code Status Orders  (From admission, onward)        Start     Ordered   07/14/17 1129  Do not attempt resuscitation (DNR)  Continuous    Question Answer Comment  In the event of cardiac or respiratory ARREST Do not call a "code blue"   In the event of cardiac or respiratory ARREST Do not perform Intubation, CPR, defibrillation or ACLS   In the event of cardiac or respiratory ARREST  Use medication by any route, position, wound care, and other measures to relive pain and suffering. May use oxygen, suction and manual treatment of airway obstruction as needed for comfort.  07/14/17 1128    Code Status History    Date Active Date Inactive Code Status Order ID Comments User Context   07/14/2017 0654 07/14/2017 1128 Full Code [id number]  Isaac Norrie, NP Inpatient       Prognosis:   < 2 weeks guarded to poor- in the setting of hypertension, end-stage alzheimers, decreased po intake, malnutrition, immobility, aspiration, bed bound, sepsis with pneumonia, and hypoxic respiratory failure  Discharge Planning:  Hospice facility  Care plan was discussed with family, bedside RN, Isaac Braun, RN (hospice liaison ), Dr Amado Coe, and CSW.   Thank you for allowing the Palliative Medicine Team to assist in the care of this patient.   Total Time 25 min.  Prolonged Time Billed  No      Greater than 50%  of this time was spent counseling and coordinating care related to the above assessment and plan.  Willette Alma, NP Amion: N.Cousar  Please contact Palliative Medicine Team phone at [phone] for questions and concerns.

## 2017-07-19 NOTE — Discharge Summary (Signed)
[facility] [place] at [facility]   PATIENT NAME: Isaac Boyle    MR#:  [id number]  DATE OF BIRTH:  [birth year]  DATE OF ADMISSION:  07/13/2017 ADMITTING PHYSICIAN: Barbaraann Rondo, MD  DATE OF DISCHARGE:  07/19/17  PRIMARY CARE PHYSICIAN: Keane Police, MD    ADMISSION DIAGNOSIS:  Acute respiratory failure with hypoxia (HCC) [J96.01] Hospital acquired PNA [J18.9] Sepsis, due to unspecified organism (HCC) [A41.9]  DISCHARGE DIAGNOSIS:  Active Problems:   Healthcare associated bacterial pneumonia   Protein-calorie malnutrition, severe   SECONDARY DIAGNOSIS:  History reviewed. No pertinent past medical history.  HOSPITAL COURSE:   HPI  Isaac Boyle  is a 72 y.o. male with a known history of Alzheimer's dementia who presents from [facility] w/ SOB/respiratory failure. Pt's two sons and wife at bedside. Pt was apparently diagnosed w/ pneumonia by CXR during the day, and was started on Levaquin. Pt's wife states she was called at 0330AM on Wednesday 07/13/2017, was informed that pt tachypneic/hypoxemic, EMS called for respiratory distress and non-reassuring breathing. Intubated in ED. Tachycardic, BP stable. SIRS (+). Diffusely diminished breath sounds B/L (L > R), coarse expiratory breath sounds, bibasilar fine crackles on lung exam. Appears thin and chronically-ill. Pt apparently non-verbal, bedbound w/ poor functional status at baseline. Family states pt on pureed diet for dysphagia at facility. Labwork (+) mild hypokalemia (3.4), hyperglycemia (174), leukocytosis (12.4). CXR (+) "Small left pleural effusion noted. Left basilar airspace opacity may reflect atelectasis or possibly infection." Admitted for treatment of sepsis 2/2 suspect acute bacterial healthcare acquired pneumonia, w/ acute hypoxemic respiratory failure.  1. 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  Patient is strict n.p.o.clinically  deteriorating; palliative care is following and according to them prognosis is less than 6 weeks as patient clinically does not improve No improvement noticed over the weekend.  Family has decided to make him comfort care and requesting to transfer him to hospice home 2. clinical sepsis with healthcare associated pneumonia with aspiration initially patient was on cefepime and vancomycin.     Repeat chest x-ray -mild increase in atelectasis  3. acute hypoxic respiratory failure.  Patient was intubated in the emergency room and then extubated.  Patient on nasal cannula oxygen.  on scopolamine patch and glycopyrrolate dose increased.  Suctioning as needed.   4. End-stage dementia.   Hematuria secondary to Foley trauma. 5. History of hypertension.   6. 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  Sinus tachycardia:  Disposition transfer patient to hospice home today   DISCHARGE CONDITIONS:   guarded  CONSULTS OBTAINED:  Treatment Team:  Barbaraann Rondo, MD   PROCEDURES intubation  DRUG ALLERGIES:  No Known Allergies  DISCHARGE MEDICATIONS:   Allergies as of 07/19/2017   No Known Allergies     Medication List    STOP taking these medications   albuterol 0.63 MG/3ML nebulizer solution Commonly known as:  ACCUNEB Replaced by:  albuterol (2.5 MG/3ML) 0.083% nebulizer solution   carvedilol 3.125 MG tablet Commonly known as:  COREG   levofloxacin 250 MG tablet Commonly known as:  LEVAQUIN   Melatonin 5 MG Tabs   potassium chloride 10 MEQ tablet Commonly known as:  K-DUR,KLOR-CON   psyllium 95 % Pack Commonly known as:  HYDROCIL/METAMUCIL     TAKE these medications   albuterol (2.5 MG/3ML) 0.083% nebulizer solution Commonly known as:  PROVENTIL Take 3 mLs (2.5 mg total) by nebulization  every 6 (six) hours as needed for wheezing or shortness of breath. Replaces:  albuterol 0.63 MG/3ML nebulizer solution   glycopyrrolate 0.2 MG/ML  injection Commonly known as:  ROBINUL Inject 1 mL (0.2 mg total) into the vein every 4 (four) hours as needed (secretions).   LORazepam 2 MG/ML injection Commonly known as:  ATIVAN Inject 0.5 mLs (1 mg total) into the vein every 4 (four) hours as needed for anxiety.   metoprolol tartrate 5 MG/5ML Soln injection Commonly known as:  LOPRESSOR Inject 5 mLs (5 mg total) into the vein every 6 (six) hours as needed (give for heart rate >110 and systolic BP >150).   morphine 2 MG/ML injection Inject 1 mL (2 mg total) into the vein every hour as needed (shortness of breath).   ondansetron 4 MG disintegrating tablet Commonly known as:  ZOFRAN-ODT Take 1 tablet (4 mg total) by mouth every 6 (six) hours as needed for nausea.   polyvinyl alcohol 1.4 % ophthalmic solution Commonly known as:  LIQUIFILM TEARS Place 1 drop into both eyes 4 (four) times daily as needed for dry eyes.   scopolamine 1 MG/3DAYS Commonly known as:  TRANSDERM-SCOP Place 1 patch (1.5 mg total) onto the skin every 3 (three) days. Start taking on:  07/21/2017        DISCHARGE INSTRUCTIONS:   Transfer patient to hospice home  DIET:  Reg diet as tolerated  DISCHARGE CONDITION:  guarded  ACTIVITY:  Bedrest  OXYGEN:  Home Oxygen: Yes.     Oxygen Delivery: 2 liters/min via Patient connected to nasal cannula oxygen  DISCHARGE LOCATION:    Hospice home  If you experience worsening of your admission symptoms, develop shortness of breath, life threatening emergency, suicidal or homicidal thoughts you must seek medical attention immediately by calling 911 or calling your MD immediately  if symptoms less severe.  You Must read complete instructions/literature along with all the possible adverse reactions/side effects for all the Medicines you take and that have been prescribed to you. Take any new Medicines after you have completely understood and accpet all the possible adverse reactions/side effects.   Please  note  You were cared for by a hospitalist during your hospital stay. If you have any questions about your discharge medications or the care you received while you were in the hospital after you are discharged, you can call the unit and asked to speak with the hospitalist on call if the hospitalist that took care of you is not available. Once you are discharged, your primary care physician will handle any further medical issues. Please note that NO REFILLS for any discharge medications will be authorized once you are discharged, as it is imperative that you return to your primary care physician (or establish a relationship with a primary care physician if you do not have one) for your aftercare needs so that they can reassess your need for medications and monitor your lab values.     Today  Chief Complaint  Patient presents with  . Shortness of Breath   Patient is not arousable, comfort care  ROS: Unobtainable   VITAL SIGNS:  Blood pressure (!) 151/109, pulse (!) 140, temperature 98.9 F (37.2 C), temperature source Oral, resp. rate 20, height  (1.803 m), weight 62.8 kg (138 lb 7.2 oz), SpO2 99 %.  I/O:    Intake/Output Summary (Last 24 hours) at 07/19/2017 1048 Last data filed at 07/19/2017 0400 Gross per 24 hour  Intake 1198.89 ml  Output -  Net 1198.89 ml    PHYSICAL EXAMINATION:  GENERAL:  72 y.o.-year-old patient lying in the bed with no acute distress.  EYES: Pupils equal, round, reactive to light and accommodation. No scleral icterus. HEENT: Head atraumatic, normocephalic. Oropharynx dry NECK:  Supple, no jugular venous distention. No thyroid enlargement, no tenderness.  LUNGS: Diminished, coarse breath sounds bilaterally, no wheezing, does has rales,rhonchi or crepitation. No use of accessory muscles of respiration.  CARDIOVASCULAR: S1, S2 normal. No murmurs, rubs, or gallops.  ABDOMEN: Soft, non-tender, non-distended. Bowel sounds present EXTREMITIES: No pedal  edema, cyanosis, or clubbing.  NEUROLOGIC: Not arousable PSYCHIATRIC: The patient is disoriented SKIN: No obvious rash, lesion, or ulcer.   DATA REVIEW:   CBC Recent Labs  Lab 07/14/17 0655  WBC 11.6*  HGB 14.2  HCT 43.8  PLT 155    Chemistries  Recent Labs  Lab 07/13/17 0409 07/13/17 0635 07/14/17 0655  NA 140  --  141  K 3.4*  --  3.9  CL 104  --  108  CO2 24  --  23  GLUCOSE 174*  --  95  BUN 18  --  16  CREATININE 1.05  --  1.08  CALCIUM 9.7  --  9.3  MG  --  1.5*  --   AST 28  --   --   ALT 14*  --   --   ALKPHOS 82  --   --   BILITOT 0.8  --   --     Cardiac Enzymes Recent Labs  Lab 07/13/17 0409  TROPONINI 0.03*    Microbiology Results  Results for orders placed or performed during the hospital encounter of 07/13/17  Blood Culture (routine x 2)     Status: None   Collection Time: 07/13/17  4:09 AM  Result Value Ref Range Status   Specimen Description BLOOD RIGHT WRIST  Final   Special Requests   Final    BOTTLES DRAWN AEROBIC AND ANAEROBIC Blood Culture results may not be optimal due to an excessive volume of blood received in culture bottles   Culture   Final    NO GROWTH 5 DAYS Performed at [facility], [address] [place], [place] [address]    Report Status 07/18/2017 FINAL  Final  Blood Culture (routine x 2)     Status: None   Collection Time: 07/13/17  4:09 AM  Result Value Ref Range Status   Specimen Description BLOOD LEFT AC  Final   Special Requests   Final    BOTTLES DRAWN AEROBIC AND ANAEROBIC Blood Culture results may not be optimal due to an excessive volume of blood received in culture bottles   Culture   Final    NO GROWTH 5 DAYS Performed at [facility], [address], [place], [place] [address]    Report Status 07/18/2017 FINAL  Final  Urine culture     Status: None   Collection Time: 07/13/17  4:09 AM  Result Value Ref Range Status   Specimen Description   Final    URINE, RANDOM Performed at  [facility], [address] [place], [place] [address]    Special Requests   Final    NONE Performed at [facility], [address], [place], [place] [address]    Culture   Final    NO GROWTH Performed at [facility], [address] [place], [place] [address]    Report Status 07/14/2017 FINAL  Final  Culture, respiratory (NON-Expectorated)     Status: None   Collection Time: 07/13/17  4:10 AM  Result Value Ref Range Status   Specimen Description   Final    TRACHEAL ASPIRATE Performed at [facility], [address], [place], [place] [address]    Special Requests   Final    Normal Performed at [facility], [address] [place], [place] [address]    Gram Stain   Final    FEW WBC PRESENT, PREDOMINANTLY PMN RARE GRAM POSITIVE COCCI IN PAIRS RARE GRAM POSITIVE RODS    Culture   Final    Consistent with normal respiratory flora. Performed at [facility], [address], [place], [place] [address]    Report Status 07/15/2017 FINAL  Final  MRSA PCR Screening     Status: None   Collection Time: 07/13/17  7:33 AM  Result Value Ref Range Status   MRSA by PCR NEGATIVE NEGATIVE Final    Comment:        The GeneXpert MRSA Assay (FDA approved for NASAL specimens only), is one component of a comprehensive MRSA colonization surveillance program. It is not intended to diagnose MRSA infection nor to guide or monitor treatment for MRSA infections. Performed at [facility], [address] [place], [place] [address]   Culture, respiratory (NON-Expectorated)     Status: None   Collection Time: 07/13/17  9:25 AM  Result Value Ref Range Status   Specimen Description   Final    TRACHEAL ASPIRATE Performed at [facility], [address], [place], [place] [address]    Special Requests   Final    NONE Performed at [facility], [address] [place], [place] [address]    Gram Stain   Final     RARE WBC PRESENT, PREDOMINANTLY PMN NO ORGANISMS SEEN    Culture   Final    Consistent with normal respiratory flora. Performed at [facility], [address] [place], [place] [address]    Report Status 07/15/2017 FINAL  Final    RADIOLOGY:  Dg Chest Port 1 View  Result Date: 07/16/2017 CLINICAL DATA:  Shortness of breath EXAM: PORTABLE CHEST 1 VIEW COMPARISON:  Two days ago FINDINGS: Elevated left diaphragm with overlying streaky density that is mildly increased. No air bronchogram, effusion, or pneumothorax. Normal heart size and mediastinal contours. IMPRESSION: Mild increase in atelectasis over the elevated left diaphragm. Electronically Signed   By: Marnee Spring M.D.   On: 07/16/2017 13:51    EKG:   Orders placed or performed during the hospital encounter of 07/13/17  . ED EKG 12-Lead  . ED EKG 12-Lead  . EKG 12-Lead  . EKG 12-Lead  . EKG      Management plans discussed with the patient, family and they are in agreement.  CODE STATUS:     Code Status Orders  (From admission, onward)        Start     Ordered   07/18/17 1551  Do not attempt resuscitation (DNR)  Continuous    Question Answer Comment  In the event of cardiac or respiratory ARREST Do not call a "code blue"   In the event of cardiac or respiratory ARREST Do not perform Intubation, CPR, defibrillation or ACLS   In the event of cardiac or respiratory ARREST Use medication by any route, position, wound care, and other measures to relive pain and suffering. May use oxygen, suction and manual treatment of  airway obstruction as needed for comfort.      07/18/17 1552    Code Status History    Date Active Date Inactive Code Status Order ID Comments User Context   07/14/2017 1128 07/18/2017 1552 DNR [id number]  Glee Arvin, NP Inpatient   07/14/2017 0654 07/14/2017 1128 Full Code [id number]  Eugenie Norrie, NP Inpatient      TOTAL TIME TAKING CARE OF THIS PATIENT: 41 minutes.    Note: This dictation was prepared with Dragon dictation along with smaller phrase technology. Any transcriptional errors that result from this process are unintentional.   @  on 07/19/2017 at 10:48 AM  Between 7am to 6pm - Pager - [phone]  After 6pm go to [email or web address] - password [email or web address] [facility]  [facility]  Office  [phone]  CC: Primary care physician; Keane Police, MD

## 2017-07-19 NOTE — Progress Notes (Signed)
Pt discharged to hospice house per MD order. One IV remained per hospice liaison. Pt transported out via EMS.

## 2017-07-19 NOTE — Clinical Social Work Note (Signed)
Patient had arrangements by [name] with Hospice to transfer to Hospice Home this morning.  York Spaniel MSW,LCSW [phone]

## 2017-07-19 NOTE — Progress Notes (Signed)
Follow up visit made to new hospice home referral. Patient seen lying in bed, appeared uncomfortable, staff RN Zollie Scale made aware, PRN medications given. Palliative NP Niki in during visit , patient suctioned and mouth care performed. Report called to the Hospice home, EMS notified for transport. Hospital care team made aware. Mrs. Hunger notified via telephone of transfer. Discharge summary to be faxed to the hospice home when posted. Dayna Barker RN, BSN, [facility] [place], hospital Liaison [phone]

## 2017-07-30 DEATH — deceased

## 2017-08-29 DEATH — deceased

## 2020-01-15 IMAGING — DX DG CHEST 1V PORT
1 series · 1 of 1 positions shown · non-contrast
Comparison: Two days ago

CLINICAL DATA: Shortness of breath

EXAM:
PORTABLE CHEST 1 VIEW

[chest ap]
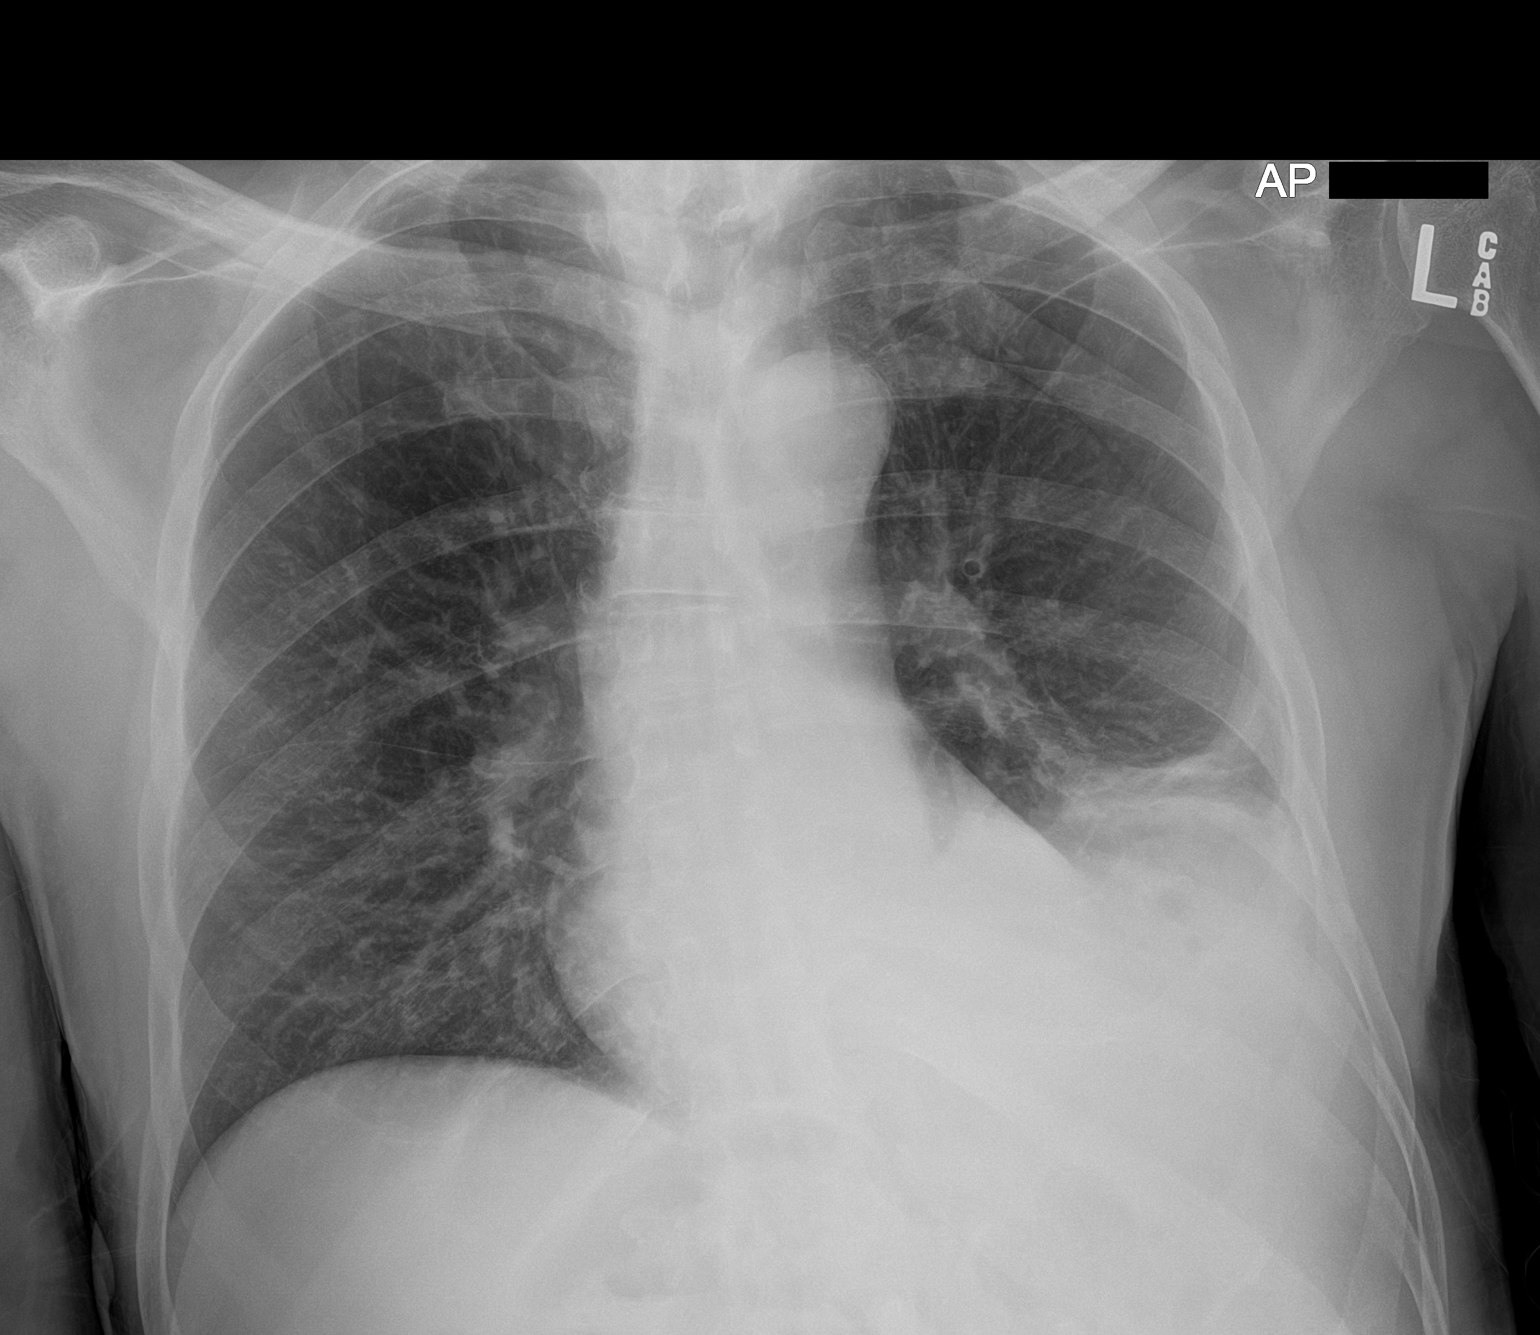

[1 of 1 positions shown; findings below may reference images not displayed]

FINDINGS: Elevated left diaphragm with overlying streaky density that is
mildly increased. No air bronchogram, effusion, or pneumothorax.
Normal heart size and mediastinal contours.
IMPRESSION: Mild increase in atelectasis over the elevated left diaphragm.
